# Patient Record
Sex: Male | Born: 1980 | Race: White | Hispanic: No | Marital: Married | Smoking: Former smoker
Health system: Southern US, Community
[De-identification: ages and names within clinical notes are randomized; demographics above are authoritative.]

## PROBLEM LIST (undated history)

## (undated) DIAGNOSIS — E669 Obesity, unspecified: Secondary | ICD-10-CM

## (undated) DIAGNOSIS — E785 Hyperlipidemia, unspecified: Secondary | ICD-10-CM

## (undated) DIAGNOSIS — E291 Testicular hypofunction: Secondary | ICD-10-CM

## (undated) DIAGNOSIS — R569 Unspecified convulsions: Secondary | ICD-10-CM

## (undated) DIAGNOSIS — I10 Essential (primary) hypertension: Secondary | ICD-10-CM

## (undated) DIAGNOSIS — K219 Gastro-esophageal reflux disease without esophagitis: Secondary | ICD-10-CM

## (undated) DIAGNOSIS — T7840XA Allergy, unspecified, initial encounter: Secondary | ICD-10-CM

## (undated) DIAGNOSIS — B690 Cysticercosis of central nervous system: Secondary | ICD-10-CM

## (undated) HISTORY — PX: BRAIN SURGERY: SHX531

## (undated) HISTORY — DX: Obesity, unspecified: E66.9

## (undated) HISTORY — DX: Testicular hypofunction: E29.1

## (undated) HISTORY — DX: Unspecified convulsions: R56.9

## (undated) HISTORY — DX: Cysticercosis of central nervous system: B69.0

## (undated) HISTORY — DX: Essential (primary) hypertension: I10

## (undated) HISTORY — DX: Hyperlipidemia, unspecified: E78.5

## (undated) HISTORY — DX: Allergy, unspecified, initial encounter: T78.40XA

## (undated) HISTORY — DX: Gastro-esophageal reflux disease without esophagitis: K21.9

---
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
## 2008-12-15 ENCOUNTER — Ambulatory Visit: Payer: Self-pay | Admitting: Internal Medicine

## 2008-12-16 ENCOUNTER — Ambulatory Visit: Payer: Self-pay | Admitting: Family Medicine

## 2008-12-16 DIAGNOSIS — R519 Headache, unspecified: Secondary | ICD-10-CM | POA: Insufficient documentation

## 2008-12-16 DIAGNOSIS — R51 Headache: Secondary | ICD-10-CM | POA: Insufficient documentation

## 2008-12-16 DIAGNOSIS — E669 Obesity, unspecified: Secondary | ICD-10-CM

## 2008-12-16 DIAGNOSIS — K219 Gastro-esophageal reflux disease without esophagitis: Secondary | ICD-10-CM | POA: Insufficient documentation

## 2008-12-16 DIAGNOSIS — G40401 Other generalized epilepsy and epileptic syndromes, not intractable, with status epilepticus: Secondary | ICD-10-CM | POA: Insufficient documentation

## 2008-12-16 DIAGNOSIS — E1165 Type 2 diabetes mellitus with hyperglycemia: Secondary | ICD-10-CM | POA: Insufficient documentation

## 2008-12-17 ENCOUNTER — Encounter (INDEPENDENT_AMBULATORY_CARE_PROVIDER_SITE_OTHER): Payer: Self-pay | Admitting: *Deleted

## 2008-12-17 LAB — CONVERTED CEMR LAB
BUN: 12 mg/dL (ref 6–23)
Calcium: 9.2 mg/dL (ref 8.4–10.5)
Creatinine,U: 191.1 mg/dL
GFR calc non Af Amer: 122.34 mL/min (ref >60)
Glucose, Bld: 163 mg/dL — ABNORMAL HIGH (ref 70–99)
Hgb A1c MFr Bld: 7.9 % — ABNORMAL HIGH (ref 4.6–6.5)
Microalb, Ur: 0.8 mg/dL (ref 0.0–1.9)
Potassium: 4.1 meq/L (ref 3.5–5.1)
VLDL: 62.8 mg/dL — ABNORMAL HIGH (ref 0.0–40.0)

## 2009-01-15 ENCOUNTER — Encounter (INDEPENDENT_AMBULATORY_CARE_PROVIDER_SITE_OTHER): Payer: Self-pay | Admitting: *Deleted

## 2009-01-15 ENCOUNTER — Ambulatory Visit: Payer: Self-pay | Admitting: Family Medicine

## 2009-01-15 DIAGNOSIS — E785 Hyperlipidemia, unspecified: Secondary | ICD-10-CM | POA: Insufficient documentation

## 2009-01-15 LAB — CONVERTED CEMR LAB: Cholesterol, target level: 200 mg/dL

## 2009-04-15 ENCOUNTER — Encounter (INDEPENDENT_AMBULATORY_CARE_PROVIDER_SITE_OTHER): Payer: Self-pay | Admitting: *Deleted

## 2010-01-26 ENCOUNTER — Telehealth (INDEPENDENT_AMBULATORY_CARE_PROVIDER_SITE_OTHER): Payer: Self-pay | Admitting: *Deleted

## 2010-03-01 ENCOUNTER — Telehealth: Payer: Self-pay | Admitting: Family Medicine

## 2010-03-29 ENCOUNTER — Ambulatory Visit: Payer: Self-pay | Admitting: Family Medicine

## 2010-03-29 DIAGNOSIS — R030 Elevated blood-pressure reading, without diagnosis of hypertension: Secondary | ICD-10-CM

## 2010-03-31 ENCOUNTER — Encounter (INDEPENDENT_AMBULATORY_CARE_PROVIDER_SITE_OTHER): Payer: Self-pay | Admitting: *Deleted

## 2010-03-31 LAB — CONVERTED CEMR LAB
ALT: 55 units/L — ABNORMAL HIGH (ref 0–53)
Alkaline Phosphatase: 62 units/L (ref 39–117)
Basophils Relative: 0.9 % (ref 0.0–3.0)
Bilirubin, Direct: 0.2 mg/dL (ref 0.0–0.3)
Calcium: 8.8 mg/dL (ref 8.4–10.5)
Chloride: 97 meq/L (ref 96–112)
Cholesterol: 174 mg/dL (ref 0–200)
Creatinine, Ser: 0.8 mg/dL (ref 0.4–1.5)
Eosinophils Relative: 2.1 % (ref 0.0–5.0)
GFR calc non Af Amer: 116.19 mL/min (ref >60)
HDL: 40.6 mg/dL (ref >39.00)
Lymphocytes Relative: 41.6 % (ref 12.0–46.0)
MCV: 85.5 fL (ref 78.0–100.0)
Monocytes Relative: 12.9 % — ABNORMAL HIGH (ref 3.0–12.0)
Neutrophils Relative %: 42.5 % — ABNORMAL LOW (ref 43.0–77.0)
Platelets: 234 10*3/uL (ref 150.0–400.0)
RBC: 4.88 M/uL (ref 4.22–5.81)
Total Bilirubin: 1 mg/dL (ref 0.3–1.2)
Total CHOL/HDL Ratio: 4
Total Protein: 6.8 g/dL (ref 6.0–8.3)
VLDL: 49.8 mg/dL — ABNORMAL HIGH (ref 0.0–40.0)
WBC: 6.2 10*3/uL (ref 4.5–10.5)

## 2010-04-06 ENCOUNTER — Encounter: Payer: Self-pay | Admitting: Family Medicine

## 2010-04-13 ENCOUNTER — Telehealth: Payer: Self-pay | Admitting: Family Medicine

## 2010-06-29 NOTE — Progress Notes (Signed)
Summary: refill request for simvastatin denied  Phone Note From Pharmacy   Caller: [facility] [address] Summary of Call: Refill request for simvastatin denied, pt needs office visit. Initial call taken by: Lowella Petties CMA,  March 01, 2010 12:38 PM  Follow-up for Phone Call        agree  dm, no ov > 1 year  schedule f/u Follow-up by: [name] MD,  March 01, 2010 1:31 PM  Additional Follow-up for Phone Call Additional follow up Details #1::        Patient number is wrong number but, I notified pharmacy rx denied and patient needs visit.Consuello Masse CMA   Additional Follow-up by: Benny Lennert CMA Duncan Dull),  March 01, 2010 2:33 PM

## 2010-06-29 NOTE — Progress Notes (Signed)
Summary: can't afford lipitor  Phone Note Call from Patient Call back at Home Phone [phone]   Caller: Spouse Call For: [name] MD Summary of Call: Wife went to pick up rx for lipitor. She had the $4.00 coupon and she said that w/ her husband's insurance they were not able to use the coupon. She says that it will cost him over $150.00. They can't afford that right now and they are asking if he can switch to something generic. Please advise. Uses [facility] [address]  Initial call taken by: Melody Comas,  April 13, 2010 11:06 AM  Follow-up for Phone Call        zocor 40 mg, 1 by mouth at bedtime, #30, 5 refills  make change, call in, med list [name] MD  April 13, 2010 11:12 AM       New/Updated Medications: ZOCOR 40 MG TABS (SIMVASTATIN) one tablet at bedtime Prescriptions: ZOCOR 40 MG TABS (SIMVASTATIN) one tablet at bedtime  #30 x 5   Entered by:   Benny Lennert CMA (AAMA)   Authorized by:   [name] MD   Signed by:   Benny Lennert CMA (AAMA) on 04/13/2010   Method used:   Electronically to        [facility] [address]* (retail)       [address] [place], [place]  [address]       Ph: [phone]       Fax: [phone]   RxID:   [id number]   Prior Medications: METFORMIN HCL 500 MG XR24H-TAB (METFORMIN HCL) take three tablets daily for 1 week then 4 tablets daily BLOOD SUGAR MONITOR LANCETS () 250.00 Dx Check blood sugar two times a day BLOOD SUGAR MONITOR STRIPS () Dx 250.00 Check blood sugar two times a day Current Allergies: ! PENICILLIN ! DILANTIN ! TEGRETOL

## 2010-06-29 NOTE — Miscellaneous (Signed)
  Clinical Lists Changes  Medications: Changed medication from METFORMIN HCL 500 MG XR24H-TAB (METFORMIN HCL) 2 tabs by mouth by mouth daily to METFORMIN HCL 500 MG XR24H-TAB (METFORMIN HCL) take three tablets daily for 1 week then 4 tablets daily - Signed Added new medication of LIPITOR 80 MG TABS (ATORVASTATIN CALCIUM) take one tablet daily - Signed Removed medication of SIMVASTATIN 40 MG TABS (SIMVASTATIN) 1 by mouth at bedtime Rx of METFORMIN HCL 500 MG XR24H-TAB (METFORMIN HCL) take three tablets daily for 1 week then 4 tablets daily;  #120 x 3;  Signed;  Entered by: Benny Lennert CMA (AAMA);  Authorized by: [name] MD;  Method used: [facility]ically to [facility] [address] [place], [place], [place]  [address], Ph: [phone], Fax: [phone] Rx of LIPITOR 80 MG TABS (ATORVASTATIN CALCIUM) take one tablet daily;  #30 x 3;  Signed;  Entered by: Benny Lennert CMA (AAMA);  Authorized by: [name] MD;  Method used: Electronically to [facility] [address], [place], [place], [place]  [address], Ph: [phone], Fax: [phone]    Prescriptions: LIPITOR 80 MG TABS (ATORVASTATIN CALCIUM) take one tablet daily  #30 x 3   Entered by:   Benny Lennert CMA (AAMA)   Authorized by:   [name] MD   Signed by:   Benny Lennert CMA (AAMA) on 03/31/2010   Method used:   Electronically to        [facility] [address] (retail)       [address] [place], [place]  [address]       Ph: [phone]       Fax: [phone]   RxID:   [id number] METFORMIN HCL 500 MG XR24H-TAB (METFORMIN HCL) take three tablets daily for 1 week then 4 tablets daily  #120 x 3   Entered by:   Benny Lennert CMA (AAMA)   Authorized by:   [name] MD   Signed by:   Benny Lennert CMA (AAMA) on 03/31/2010   Method used:   Electronically to        [facility] [address]*  (retail)       [address] [place], [place]  [address]       Ph: [phone]       Fax: [phone]   RxID:   [id number]   Prior Medications: BLOOD SUGAR MONITOR LANCETS () 250.00 Dx Check blood sugar two times a day BLOOD SUGAR MONITOR STRIPS () Dx 250.00 Check blood sugar two times a day Current Allergies: ! PENICILLIN ! DILANTIN ! TEGRETOL

## 2010-06-29 NOTE — Letter (Signed)
Summary: Unable to Contact Patient/[facility]  Unable to Contact Patient/[facility]   Imported By: Lanelle Bal 04/16/2010 14:06:09  _____________________________________________________________________  External Attachment:    Type:   Image     Comment:   External Document

## 2010-06-29 NOTE — Progress Notes (Signed)
Summary: METFORMIN  Phone Note Refill Request Message from:  Arnoldo Lenis [phone] on January 26, 2010 4:48 PM  Refills Requested: Medication #1:  METFORMIN HCL 500 MG XR24H-TAB 2 tabs by mouth by mouth daily   Last Refilled: 12/14/2009 faxed request.   Follow-up for Phone Call        left message on machine at home for patient to return my call. Pt needs to be seen for further refills, pt has cancelled appts and no-showed is this ok to refill if he make an appt? DeShannon Smith CMA Duncan Dull)  January 26, 2010 4:49 PM   Additional Follow-up for Phone Call Additional follow up Details #1::        agree refill 1 month only  let him know -- he is a diabetic, cannot go for a year without any surveilance at all! Additional Follow-up by: [name] MD,  January 26, 2010 4:56 PM    Prescriptions: METFORMIN HCL 500 MG XR24H-TAB (METFORMIN HCL) 2 tabs by mouth by mouth daily  #60 x 11   Entered by:   Benny Lennert CMA (AAMA)   Authorized by:   [name] MD   Signed by:   Benny Lennert CMA (AAMA) on 01/28/2010   Method used:   Electronically to        [facility] [address]* (retail)       [address] [place], [place]  [address]       Ph: [phone]       Fax: [phone]   RxID:   [id number]  Pharmacy advised electronic refills was supposed to be for 1 month not 11.Consuello Masse CMA    Patient advised via voice mail to call and schedule appt before furthur rx given.Consuello Masse CMA

## 2010-06-29 NOTE — Assessment & Plan Note (Signed)
Summary: F/U REFILL MEDICATION/CLE   Vital Signs:  Patient profile:   30 year old male Height:      72 inches Weight:      274.0 pounds BMI:     37.30 Temp:     97.6 degrees F oral BP sitting:   140 / 90  (left arm) Cuff size:   regular  Vitals Entered By: Benny Lennert CMA Duncan Dull) (March 29, 2010 8:07 AM)  History of Present Illness: Chief complaint refill medication(diabetes)  DM: has not been checking his BS  flu shot at work  Lipids: reports compliance  has not come back in for follow-up in > 1 year  124/80 on my recheck on BP with large cuff.  reports passing metformin capsule in his stool  no weight loss. not exercising  REVIEW OF SYSTEMS GEN: No acute illnesses, no fever, chills, sweats. CV: No chest pain or SOB GI: No noted N or V Otherwise, pertinent positives and negatives are noted in the HPI.   GEN: WDWN, NAD, Non-toxic, A & O x 3 HEENT: Atraumatic, Normocephalic. Neck supple. No masses, No LAD. Ears and Nose: No external deformity. CV: RRR, No M/G/R. No JVD. No thrill. No extra heart sounds. PULM: CTA B, no wheezes, crackles, rhonchi. No retractions. No resp. distress. No accessory muscle use. EXTR: No c/c/e NEURO: Normal gait.  PSYCH: Normally interactive. Conversant. Not depressed or anxious appearing.  Calm demeanor.    Allergies: 1)  ! Penicillin 2)  ! Dilantin 3)  ! Tegretol  Past History:  Past medical, surgical, family and social histories (including risk factors) reviewed, and no changes noted (except as noted below).  Past Medical History: GERD (ICD-530.81) h/o seizures, now no meds Diabetes mellitus, type II Obesity Hyperlipidemia hypogonadism  Past Surgical History: Reviewed history from 12/16/2008 and no changes required. none  Family History: Reviewed history from 12/16/2008 and no changes required. Family History of Arthritis Family History Diabetes 1st degree relative (father)  Social History: Reviewed history  from 12/16/2008 and no changes required. Occupation: Married Never Smoked Alcohol use-no Drug use-no Regular exercise-no  Review of Systems      See HPI General:  Denies chills and fever. CV:  Denies chest pain or discomfort; conditioning improving. GI:  See HPI. GU:  See HPI.  Physical Exam  Additional Exam:  GEN: WDWN, NAD, Non-toxic, A & O x 3 HEENT: Atraumatic, Normocephalic. Neck supple. No masses, No LAD. Ears and Nose: No external deformity. CV: RRR, No M/G/R. No JVD. No thrill. No extra heart sounds. PULM: CTA B, no wheezes, crackles, rhonchi. No retractions. No resp. distress. No accessory muscle use. EXTR: No c/c/e NEURO: Normal gait.  PSYCH: Normally interactive. Conversant. Not depressed or anxious appearing.  Calm demeanor.     Impression & Recommendations:  Problem # 1:  DIABETES MELLITUS, TYPE II (ICD-250.00)  His updated medication list for this problem includes:    Metformin Hcl 500 Mg Xr24h-tab (Metformin hcl) .Marland Kitchen... 2 tabs by mouth by mouth daily  Orders: TLB-A1C / Hgb A1C (Glycohemoglobin) (83036-A1C) Diabetic Clinic Referral (Diabetic)  Problem # 2:  HYPERLIPIDEMIA (ICD-272.4)  His updated medication list for this problem includes:    Simvastatin 40 Mg Tabs (Simvastatin) .Marland Kitchen... 1 by mouth at bedtime  Orders: Venipuncture (46962) TLB-Lipid Panel (80061-LIPID) TLB-Hepatic/Liver Function Pnl (80076-HEPATIC)  Problem # 3:  ELEVATED BLOOD PRESSURE WITHOUT DIAGNOSIS OF HYPERTENSION (ICD-796.2) normal on second check follow at home  Problem # 4:  OBESITY (ICD-278.00) Assessment: Unchanged  Complete Medication  List: 1)  Metformin Hcl 500 Mg Xr24h-tab (Metformin hcl) .... 2 tabs by mouth by mouth daily 2)  Blood Sugar Monitor Lancets  .Marland KitchenMarland Kitchen. 250.00 dx check blood sugar two times a day 3)  Blood Sugar Monitor Strips  .... Dx 250.00 check blood sugar two times a day 4)  Simvastatin 40 Mg Tabs (Simvastatin) .Marland Kitchen.. 1 by mouth at bedtime  Other  Orders: TLB-BMP (Basic Metabolic Panel-BMET) (80048-METABOL) TLB-CBC Platelet - w/Differential (85025-CBCD) Pneumococcal Vaccine (04540) Admin 1st Vaccine (98119)  Patient Instructions: 1)  Referral Appointment Information 2)  Day/Date: 3)  Time: 4)  Place/MD: 5)  Address: 6)  Phone/Fax: 7)  Patient given appointment information. Information/Orders faxed/mailed.  Prescriptions: SIMVASTATIN 40 MG TABS (SIMVASTATIN) 1 by mouth at bedtime  #30 x 0   Entered by:   Benny Lennert CMA (AAMA)   Authorized by:   [name] MD   Signed by:   Benny Lennert CMA (AAMA) on 03/29/2010   Method used:   Electronically to        [facility] [address]* (retail)       [address] [place], [place]  [address]       Ph: [phone]       Fax: [phone]   RxID:   [id number] METFORMIN HCL 500 MG XR24H-TAB (METFORMIN HCL) 2 tabs by mouth by mouth daily  #60 x 11   Entered by:   Benny Lennert CMA (AAMA)   Authorized by:   [name] MD   Signed by:   Benny Lennert CMA (AAMA) on 03/29/2010   Method used:   Electronically to        [facility] [address] (retail)       [address] [place], [place]  [address]       Ph: [phone]       Fax: [phone]   RxID:   [id number] SIMVASTATIN 40 MG TABS (SIMVASTATIN) 1 by mouth at bedtime  #30 x 0   Entered and Authorized by:   [name] MD   Signed by:   [name] MD on 03/29/2010   Method used:   Electronically to        [facility] [facility] (retail[facility]       [address]       [place], [place]  [address]       Ph: [phone] or [phone]       Fax: [phone]   RxID:   [id number] METFORMIN HCL 500 MG XR24H-TAB (METFORMIN HCL) 2 tabs by mouth by mouth daily  #60 x 11   Entered and Authorized by:   [name] MD   Signed by:   [name] MD on 03/29/2010   Method used:   [facility]        [facility] [facility]       [address]       [place], [place]  [address]       Ph: [phone] or [phone]       Fax: [phone]   RxID:   [id number]    Orders Added: 1)  Venipuncture [[address]] 2)  TLB-Lipid Panel [80061-LIPID] 3)  TLB-BMP (Basic Metabolic Panel-BMET) [80048-METABOL] 4)  TLB-CBC Platelet - w/Differential [85025-CBCD] 5)  TLB-Hepatic/Liver Function Pnl [80076-HEPATIC] 6)  TLB-A1C /  Hgb A1C (Glycohemoglobin) [83036-A1C] 7)  Pneumococcal Vaccine [90732] 8)  Admin 1st Vaccine [90471] 9)  Diabetic Clinic Referral [Diabetic] 10)  Est. Patient Level IV [40981]   Immunizations Administered:  Pneumonia Vaccine:    Vaccine Type: Pneumovax    Site: right deltoid    Mfr: Merck    Dose: 0.5 ml    Route: [place]    Given by: Benny Lennert CMA (AAMA)    Exp. Date: 09/21/2011    Lot #: [id number]    VIS given: 05/04/09 version given March 29, 2010.   Immunizations Administered:  Pneumonia Vaccine:    Vaccine Type: Pneumovax    Site: right deltoid    Mfr: Merck    Dose: 0.5 ml    Route:     Given by: Benny Lennert CMA (AAMA)    Exp. Date: 09/21/2011    Lot #: [id number]    VIS given: 05/04/09 version given March 29, 2010.  Current Allergies (reviewed today): ! PENICILLIN ! DILANTIN ! TEGRETOL

## 2010-07-06 IMAGING — CT CT HEAD WITHOUT CONTRAST
1 series · 16 of 30 positions shown, 20 images · non-contrast
Comparison: none

REASON FOR EXAM: HA nausea malaise fatigue
COMMENTS:

[Series 2: soft tissue · axial · 0.39mm/px · z∈[+827,+962]mm · 16 of 31 slices shown, 20 images]
[im 2/31  brain]
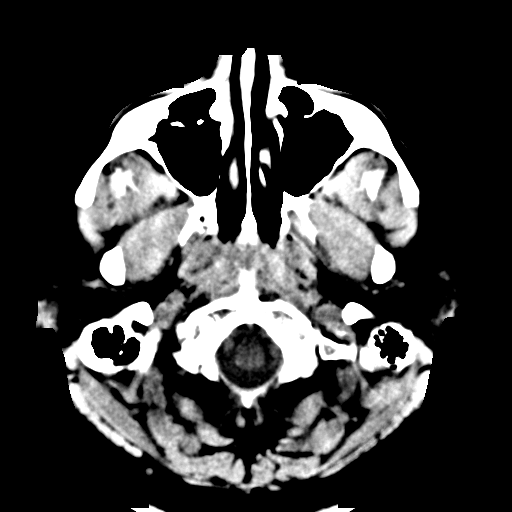
[im 2/31  bone]
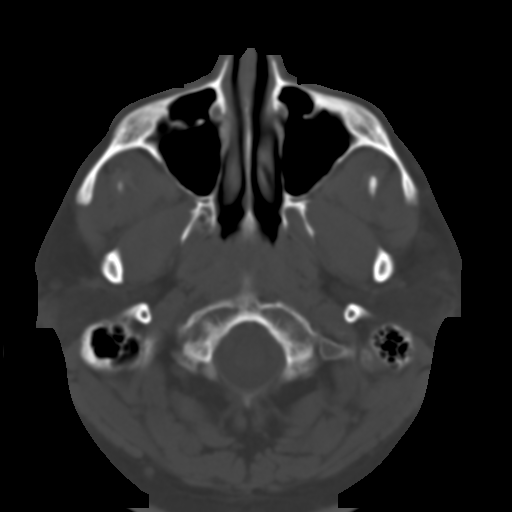
[im 4/31  brain]
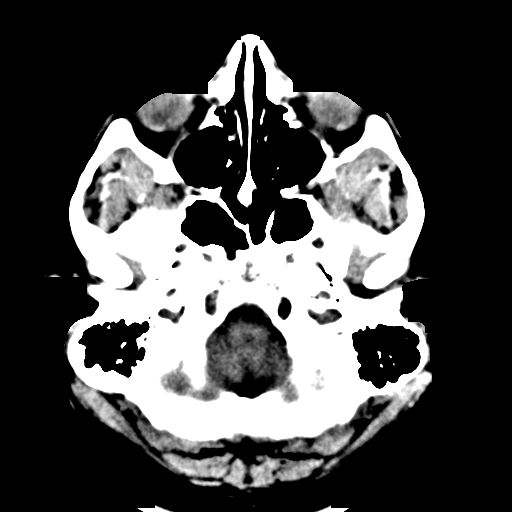
[im 6/31  brain]
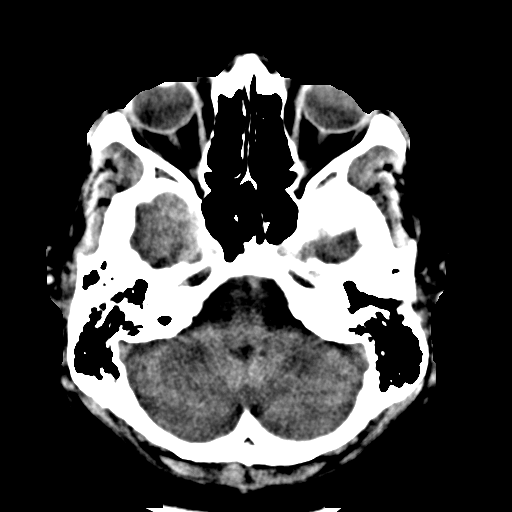
[im 8/31  brain]
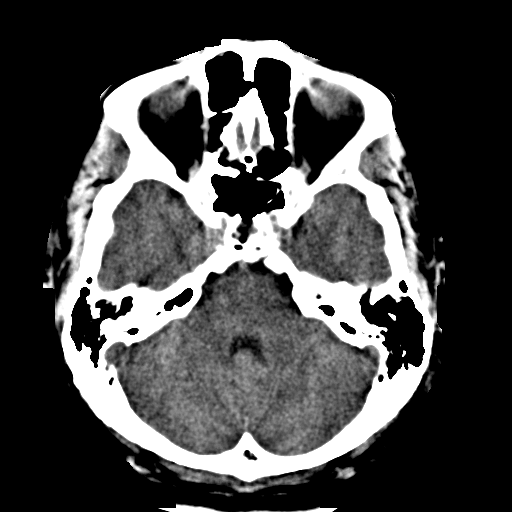
[im 9/31  brain]
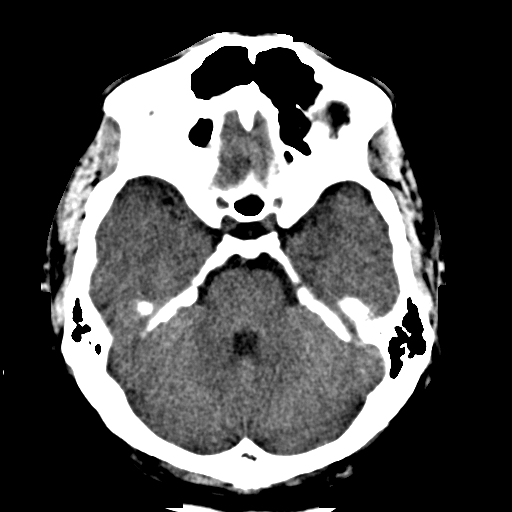
[im 9/31  bone]
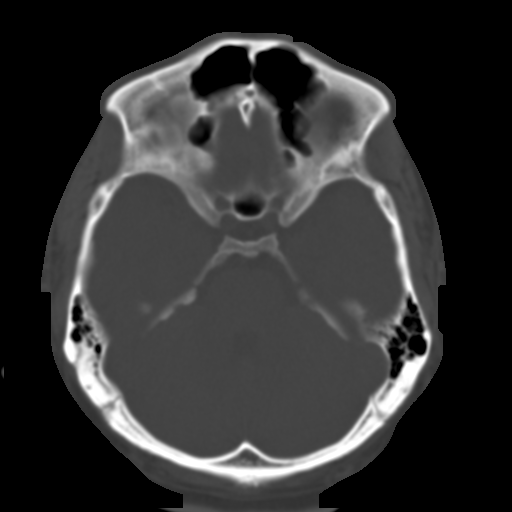
[im 11/31  brain]
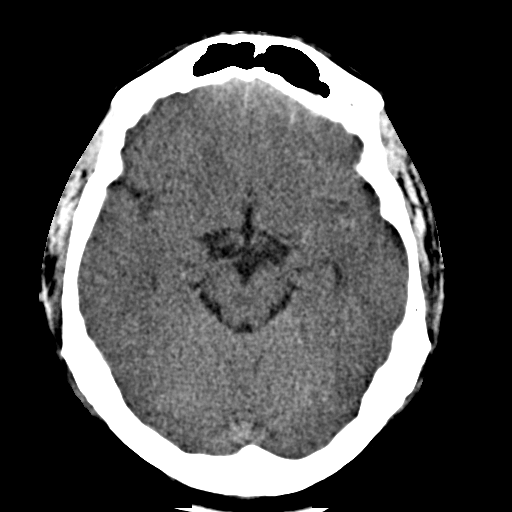
[im 13/31  brain]
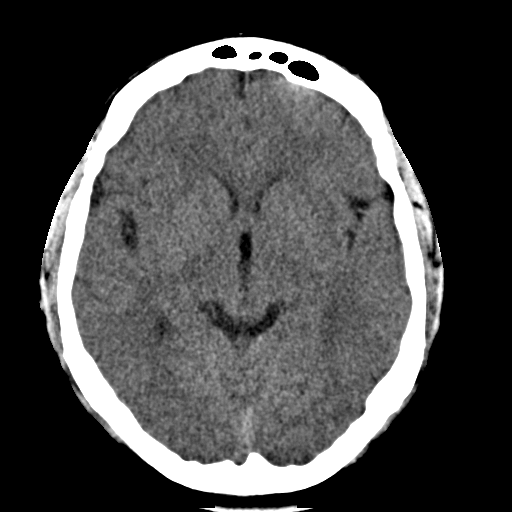
[im 15/31  brain]
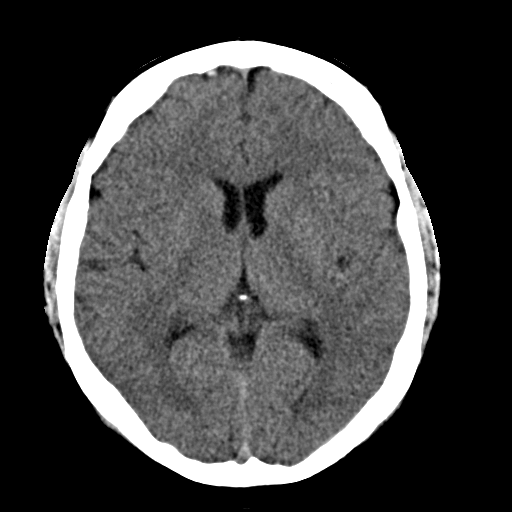
[im 16/31  brain]
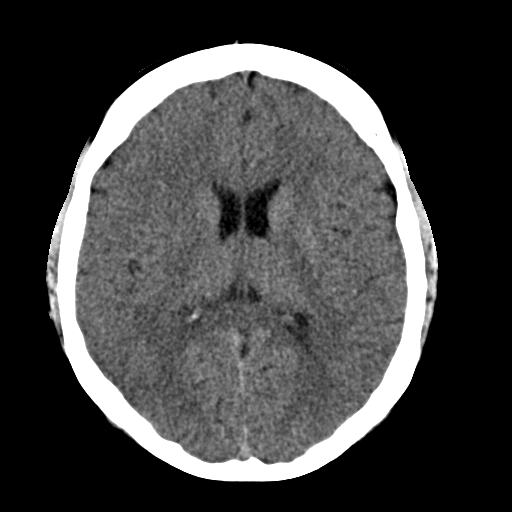
[im 16/31  bone]
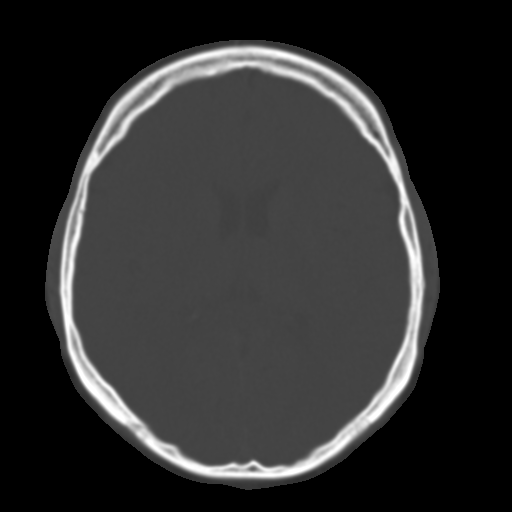
[im 18/31  brain]
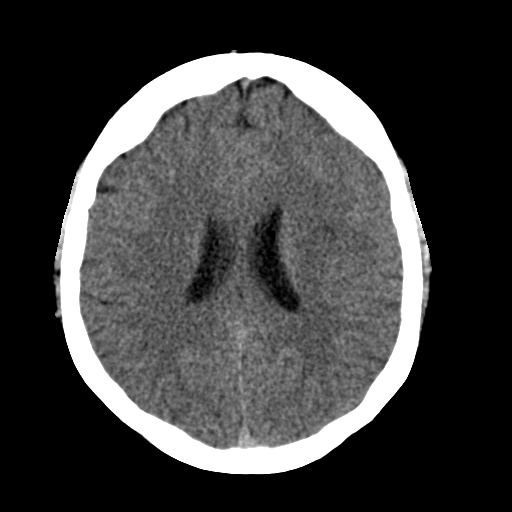
[im 20/31  brain]
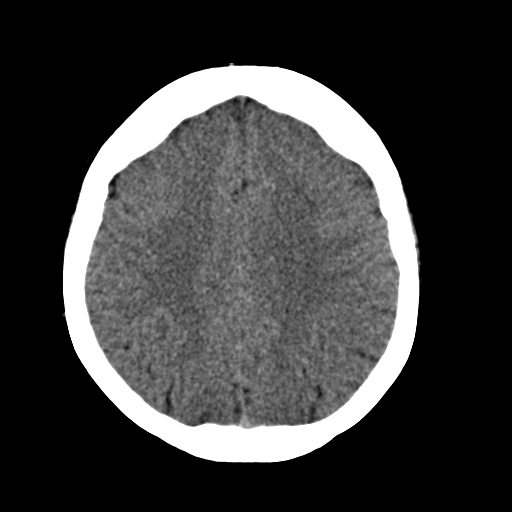
[im 22/31  brain]
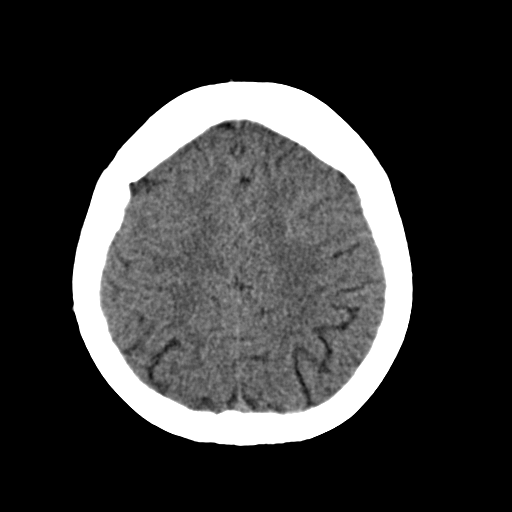
[im 23/31  brain]
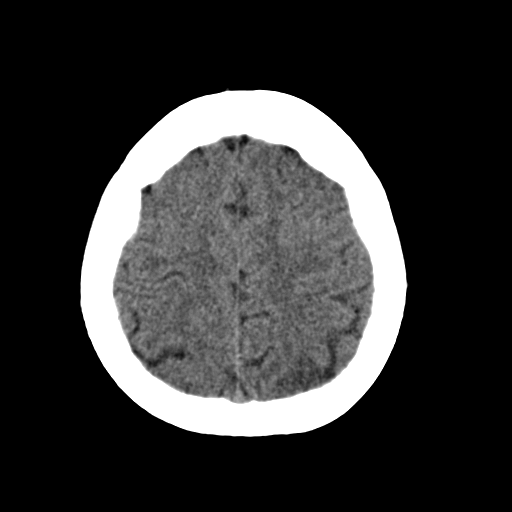
[im 23/31  bone]
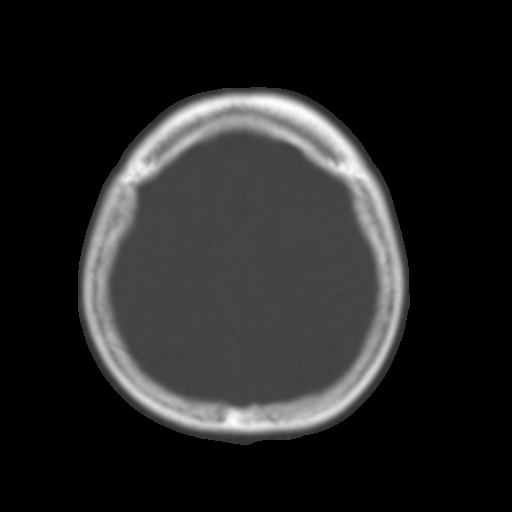
[im 25/31  brain]
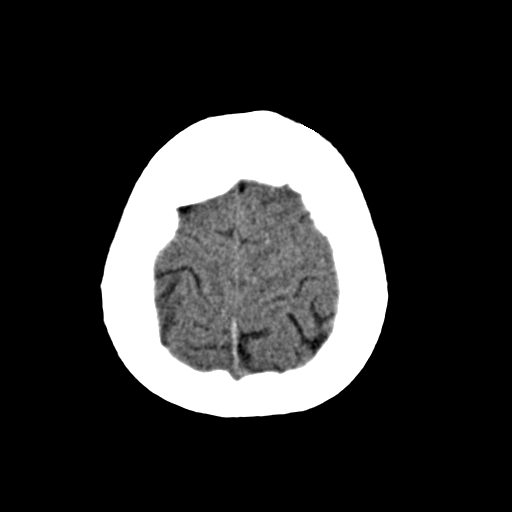
[im 27/31  brain]
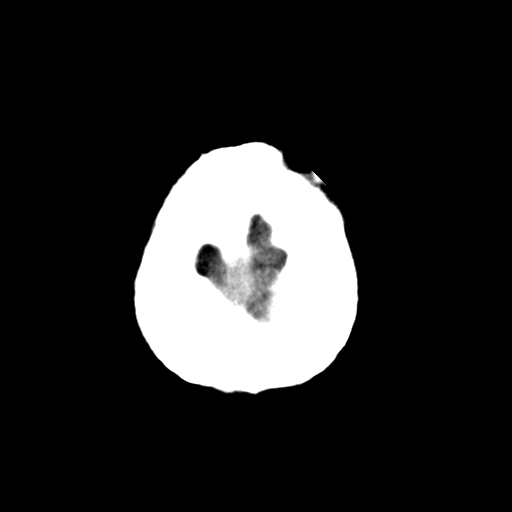
[im 29/31  brain]
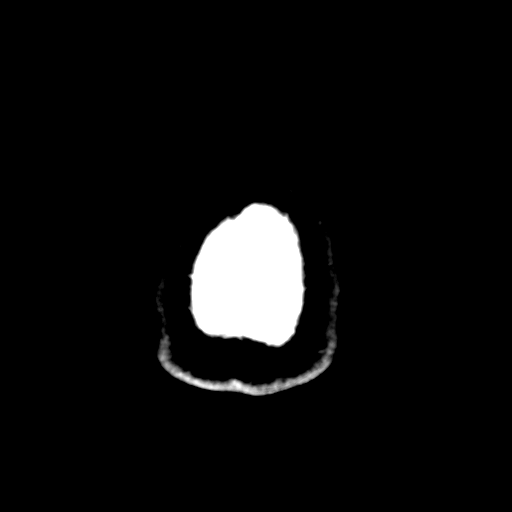

[16 of 30 positions shown; findings below may reference images not displayed]

PROCEDURE:     CT  - CT HEAD WITHOUT CONTRAST  - December 15, 2008  [DATE]

RESULT:       Helical 5 mm sections were obtained from the skull base to the
vertex without the administration of intravenous contrast. Also included is
bone windowing of the osseous structures.

There is no evidence of intraaxial or extraaxial fluid collections. There is
no evidence of acute hemorrhage or secondary signs reflecting mass effect,
subacute or chronic infarction.  The visualized bony skeleton is evaluated
and there is no evidence of a depressed skull fracture or further fracture
or dislocation.  The visualized mastoid air cells are clear.  The
ventricles, cisterns and sulci are symmetric and patent.

If there is persistent clinical concern, further evaluation with Brain MRI
is recommended if clinically indicated.
IMPRESSION: Unremarkable Head CT as described above.

## 2010-08-04 ENCOUNTER — Encounter: Payer: Self-pay | Admitting: Family Medicine

## 2010-09-30 ENCOUNTER — Ambulatory Visit (INDEPENDENT_AMBULATORY_CARE_PROVIDER_SITE_OTHER): Payer: BC Managed Care – PPO | Admitting: Family Medicine

## 2010-09-30 ENCOUNTER — Encounter: Payer: Self-pay | Admitting: Family Medicine

## 2010-09-30 ENCOUNTER — Ambulatory Visit: Payer: Self-pay | Admitting: Family Medicine

## 2010-09-30 DIAGNOSIS — E785 Hyperlipidemia, unspecified: Secondary | ICD-10-CM

## 2010-09-30 DIAGNOSIS — I10 Essential (primary) hypertension: Secondary | ICD-10-CM

## 2010-09-30 DIAGNOSIS — E119 Type 2 diabetes mellitus without complications: Secondary | ICD-10-CM

## 2010-09-30 LAB — LIPID PANEL
HDL: 46.6 mg/dL (ref >39.00)
LDL Cholesterol: 118 mg/dL — ABNORMAL HIGH (ref 0–99)
VLDL: 32.6 mg/dL (ref 0.0–40.0)

## 2010-09-30 LAB — HEMOGLOBIN A1C: Hgb A1c MFr Bld: 8.2 % — ABNORMAL HIGH (ref 4.6–6.5)

## 2010-09-30 MED ORDER — LISINOPRIL 10 MG PO TABS: 10.0000 mg | ORAL_TABLET | Freq: Every day | ORAL | Status: DC

## 2010-09-30 NOTE — Progress Notes (Signed)
30 year old:  Diabetes Mellitus: Tolerating Medications: Compliance with diet: fair - lost 12 pounds Exercise: none Avg blood sugars at home: 150 Foot problems: none Hypoglycemia: none No nausea, vomitting, blurred vision, polyuria.  Lab Results  Component Value Date   HGBA1C 8.2* 09/30/2010      Chemistry      Component Value Date/Time   NA 140 03/29/2010 0832   K 4.1 03/29/2010 0832   CL 97 03/29/2010 0832   CO2 28 03/29/2010 0832   BUN 11 03/29/2010 0832   CREATININE 0.8 03/29/2010 0832      Component Value Date/Time   CALCIUM 8.8 03/29/2010 0832   ALKPHOS 62 03/29/2010 0832   AST 34 03/29/2010 0832   ALT 55* 03/29/2010 0832   BILITOT 1.0 03/29/2010 0832     Lipids: Could not afford Lipitor co-pay, back on generic statin. Tolerating meds fine with no SE. Panel reviewed with patient - from fall  Lab Results  Component Value Date   CHOL 197 09/30/2010   CHOL 174 03/29/2010   CHOL 265* 12/16/2008   Lab Results  Component Value Date   HDL 46.60 09/30/2010   HDL 40.60 03/29/2010   HDL 42.70 12/16/2008   Lab Results  Component Value Date   LDLCALC 118* 09/30/2010   LDLCALC 164 12/16/2008   Lab Results  Component Value Date   TRIG 163.0* 09/30/2010   TRIG 249.0* 03/29/2010   TRIG 314.0* 12/16/2008   Lab Results  Component Value Date   CHOLHDL 4 09/30/2010   CHOLHDL 4 03/29/2010   CHOLHDL 6 12/16/2008   Lab Results  Component Value Date   LDLDIRECT 95.3 03/29/2010   LDLDIRECT 164.0 12/16/2008    Lab Results  Component Value Date   ALT 55* 03/29/2010   AST 34 03/29/2010   ALKPHOS 62 03/29/2010   BILITOT 1.0 03/29/2010   HTN:  New dx.  Recheck, 148/88 No CP, no sob. No HA.  BP Readings from Last 3 Encounters:  09/30/10 140/80  03/29/10 140/90  01/15/09 120/80   Dad lost a toe from diabetes.   The PMH, PSH, Social History, Family History, Medications, and allergies have been reviewed in [facility], and have been updated if relevant.  ROS: GEN: No acute  illnesses, no fevers, chills. GI: No n/v/d, eating normally Pulm: No SOB Interactive and getting along well at home.  Otherwise, ROS is as per the HPI.  GEN: WDWN, NAD, Non-toxic, A & O x 3 HEENT: Atraumatic, Normocephalic. Neck supple. No masses, No LAD. Ears and Nose: No external deformity. CV: RRR, No M/G/R. No JVD. No thrill. No extra heart sounds. PULM: CTA B, no wheezes, crackles, rhonchi. No retractions. No resp. distress. No accessory muscle use.  EXTR: No c/c/e NEURO Normal gait.  PSYCH: Normally interactive. Conversant. Not depressed or anxious appearing.  Calm demeanor.

## 2010-09-30 NOTE — Patient Instructions (Signed)
F/u 6 weeks    Hypertension (High Blood Pressure) Most people with high blood pressure (hypertension) have no symptoms. High blood pressure can be a dangerous problem. Hypertension is dangerous because you may have it and not know it. High blood pressure may mean that your heart needs to work harder to pump blood.  Your blood pressure is measured with 2 numbers. The first number is when your heart flexes (contracts), and the second number is when your heart relaxes. The higher the numbers are, the more you are at risk for problems. Write down your blood pressure today. The best blood pressure for adults is 120/80 (mmHg) or lower. It is likely that your blood pressure was recorded at least 2 times today. It is important for you to give these numbers to your doctor. If you do not have a doctor, try to get follow-up care at a hospital or community clinic. You may need to start high blood pressure medicine. You may also need to adjust your medicines as told by your doctor. Even mild high blood pressure increases long-term health risks. One high reading does not mean you have hypertension.  Your blood pressure should be taken when you are relaxed. It is also important to sit for about 10 minutes before being tested.   Things that can increase your blood pressure are:   Injury.  Illness.   Stress.   Caffeine.  Some medicines (like decongestants).    High blood pressure does not usually need emergency treatment.  HOME CARE  Lifestyle and medicine changes may be needed, including:   Weight loss.   Exercise.   Limit the use of salt.   Stop smoking.   If using decongestants or birth control pills, talk to your doctor. These medicines might make blood pressure higher.   Do not use street drugs.   Females should not drink more than 1 alcoholic drink per day. Males should not drink more than 2 alcoholic drinks per day.   Take your blood pressure medicine. You will need to take it every  day. If you do not get treated, there are risks, including:   Heart disease.   Stroke.   Kidney failure.   See your doctor as told.  GET HELP RIGHT AWAY IF:  You get a very bad headache.   You get blurred or changing vision.   You feel confused.   You feel weak, numb, or faint.   You get chest or belly (abdominal) pain.   You throw up (vomit).   You cannot breathe very well.  If you have a blood pressure reading with a top number of 180 or higher, you need to see your doctor right away. This is especially true if you are having any of the problems listed above. MAKE SURE YOU:  Understand these instructions.   Will watch your condition.   Will get help right away if you are not doing well or get worse.  Document Released: 11/02/2007 Document Re-Released: 11/03/2009 [facility] Patient Information 2011 [place], [place].

## 2010-10-01 ENCOUNTER — Encounter: Payer: Self-pay | Admitting: Family Medicine

## 2010-10-01 DIAGNOSIS — I10 Essential (primary) hypertension: Secondary | ICD-10-CM | POA: Insufficient documentation

## 2010-10-01 HISTORY — DX: Essential (primary) hypertension: I10

## 2010-10-05 ENCOUNTER — Encounter: Payer: Self-pay | Admitting: *Deleted

## 2010-10-11 ENCOUNTER — Other Ambulatory Visit: Payer: Self-pay | Admitting: *Deleted

## 2010-10-11 MED ORDER — SIMVASTATIN 40 MG PO TABS: 40.0000 mg | ORAL_TABLET | Freq: Every day | ORAL | Status: DC

## 2010-10-18 ENCOUNTER — Telehealth: Payer: Self-pay | Admitting: *Deleted

## 2010-10-18 MED ORDER — LOSARTAN POTASSIUM 25 MG PO TABS: 25.0000 mg | ORAL_TABLET | Freq: Every day | ORAL | Status: DC

## 2010-10-18 NOTE — Telephone Encounter (Signed)
Pt has been taking lisinopril for about 2 weeks and has been coughing and has not felt well since starting it. He was told to call and report if he had any problems with it.

## 2010-10-18 NOTE — Telephone Encounter (Signed)
D/c lisinopril  Sent in Losartan - a different BP med into [facility].   It may take up to a month for all coughing to go away assuming it is from the medication.

## 2010-10-19 NOTE — Telephone Encounter (Signed)
Patients wife advised via message on machine

## 2010-11-16 ENCOUNTER — Ambulatory Visit (INDEPENDENT_AMBULATORY_CARE_PROVIDER_SITE_OTHER): Payer: BC Managed Care – PPO | Admitting: Family Medicine

## 2010-11-16 ENCOUNTER — Encounter: Payer: Self-pay | Admitting: Family Medicine

## 2010-11-16 DIAGNOSIS — E785 Hyperlipidemia, unspecified: Secondary | ICD-10-CM

## 2010-11-16 DIAGNOSIS — E119 Type 2 diabetes mellitus without complications: Secondary | ICD-10-CM

## 2010-11-16 DIAGNOSIS — I10 Essential (primary) hypertension: Secondary | ICD-10-CM

## 2010-11-16 MED ORDER — LOSARTAN POTASSIUM 50 MG PO TABS: 50.0000 mg | ORAL_TABLET | Freq: Every day | ORAL | Status: DC

## 2010-11-16 NOTE — Progress Notes (Signed)
Benjamin Proctor, a 30 y.o. male presents today in the office for the following:   Wt Readings from Last 3 Encounters:  11/16/10 265 lb (120.203 kg)  09/30/10 262 lb 6.4 oz (119.024 kg)  03/29/10 274 lb (124.286 kg)   DM: BS 130-140, better off sodas  HTN: 130/85  HTN: Tolerating all medications without side effects (had cough from lisinopril) Stable and at goal No CP, no sob. No HA.  BP Readings from Last 3 Encounters:  11/16/10 130/90  09/30/10 140/80  03/29/10 140/90    Basic Metabolic Panel:    Component Value Date/Time   NA 140 03/29/2010 0832   K 4.1 03/29/2010 0832   CL 97 03/29/2010 0832   CO2 28 03/29/2010 0832   BUN 11 03/29/2010 0832   CREATININE 0.8 03/29/2010 0832   GLUCOSE 189* 03/29/2010 0832   CALCIUM 8.8 03/29/2010 0832    Lipids: tol meds  The PMH, PSH, Social History, Family History, Medications, and allergies have been reviewed in [facility], and have been updated if relevant.  ROS: GEN: No acute illnesses, no fevers, chills. GI: No n/v/d, eating normally Pulm: No SOB Interactive and getting along well at home.  Otherwise, ROS is as per the HPI.   Physical Exam  Blood pressure 130/90, pulse 96, temperature 97.7 F (36.5 C), temperature source Oral, weight 265 lb (120.203 kg), SpO2 98.00%.  GEN: WDWN, NAD, Non-toxic, A & O x 3 HEENT: Atraumatic, Normocephalic. Neck supple. No masses, No LAD. Ears and Nose: No external deformity. CV: RRR, No M/G/R. No JVD. No thrill. No extra heart sounds. PULM: CTA B, no wheezes, crackles, rhonchi. No retractions. No resp. distress. No accessory muscle use. EXTR: No c/c/e NEURO Normal gait.  PSYCH: Normally interactive. Conversant. Not depressed or anxious appearing.  Calm demeanor.   A/P: HTN, improving, not at goal, increase ARB

## 2010-11-16 NOTE — Patient Instructions (Signed)
F/u 3-4 months  WE INCREASE THE LOSARTAN (BLOOD PRESSURE MEDICINE) -- TO 50 MG. TAKE YOUR 25 MG TABLETS YOU HAVE NOW, AND TAKE 2 A DAY   WITH THE REFILL, 1 A DAY.  Work on your diet: Try to eat minimal fatty food and eat more fruits and vegetables. Anything fried is bad, cream, beef and pork fat are bad.  Exercise is incredibly important to heart health. Try to exercise for at least 30 minutes 5 - 6 times a week

## 2010-12-03 ENCOUNTER — Other Ambulatory Visit: Payer: Self-pay | Admitting: *Deleted

## 2010-12-03 MED ORDER — METFORMIN HCL ER (MOD) 500 MG PO TB24: 2000.0000 mg | ORAL_TABLET | Freq: Every day | ORAL | Status: DC

## 2010-12-20 ENCOUNTER — Other Ambulatory Visit: Payer: Self-pay | Admitting: *Deleted

## 2010-12-20 MED ORDER — METFORMIN HCL ER (MOD) 500 MG PO TB24: 2000.0000 mg | ORAL_TABLET | Freq: Every day | ORAL | Status: DC

## 2010-12-30 ENCOUNTER — Ambulatory Visit (INDEPENDENT_AMBULATORY_CARE_PROVIDER_SITE_OTHER): Payer: BC Managed Care – PPO | Admitting: Family Medicine

## 2010-12-30 ENCOUNTER — Encounter: Payer: Self-pay | Admitting: Family Medicine

## 2010-12-30 VITALS — BP 124/82 | HR 88 | Temp 97.6°F | Ht 73.0 in | Wt 266.0 lb

## 2010-12-30 DIAGNOSIS — L989 Disorder of the skin and subcutaneous tissue, unspecified: Secondary | ICD-10-CM

## 2010-12-30 NOTE — Progress Notes (Signed)
Benjamin Proctor, a 30 y.o. male presents today in the office for the following:    Knot, back of R leg  No trauma or injury No pain Just distal to the gastroc No pain at achilles Walking easily  The PMH, PSH, Social History, Family History, Medications, and allergies have been reviewed in [facility], and have been updated if relevant.  ROS: GEN: No acute illnesses, no fevers, chills. GI: No n/v/d, eating normally Pulm: No SOB Interactive and getting along well at home.  Otherwise, ROS is as per the HPI.   Physical Exam  Blood pressure 124/82, pulse 88, temperature 97.6 F (36.4 C), temperature source Oral, height 6\' 1"  (1.854 m), weight 266 lb (120.657 kg), SpO2 99.00%.  GEN: WDWN, NAD, Non-toxic, A & O x 3 HEENT: Atraumatic, Normocephalic. Neck supple. No masses, No LAD. Ears and Nose: No external deformity. EXTR: No c/c/e, palpable area about 1 1/2 cm across, non-fixed, movable post R leg. Neg Homan's sign NEURO Normal gait.  PSYCH: Normally interactive. Conversant. Not depressed or anxious appearing.  Calm demeanor.   Assessment and Plan: 1.  Leg area, lipoma vs cyst. Reassured. Would only follow at this point

## 2011-03-14 ENCOUNTER — Other Ambulatory Visit (INDEPENDENT_AMBULATORY_CARE_PROVIDER_SITE_OTHER): Payer: BC Managed Care – PPO

## 2011-03-14 DIAGNOSIS — E119 Type 2 diabetes mellitus without complications: Secondary | ICD-10-CM

## 2011-03-14 DIAGNOSIS — E785 Hyperlipidemia, unspecified: Secondary | ICD-10-CM

## 2011-03-14 LAB — LDL CHOLESTEROL, DIRECT: Direct LDL: 94.9 mg/dL

## 2011-03-14 LAB — LIPID PANEL
HDL: 45 mg/dL (ref >39.00)
Total CHOL/HDL Ratio: 4
Triglycerides: 221 mg/dL — ABNORMAL HIGH (ref 0.0–149.0)

## 2011-03-21 ENCOUNTER — Encounter: Payer: Self-pay | Admitting: Family Medicine

## 2011-03-21 ENCOUNTER — Ambulatory Visit (INDEPENDENT_AMBULATORY_CARE_PROVIDER_SITE_OTHER): Payer: BC Managed Care – PPO | Admitting: Family Medicine

## 2011-03-21 DIAGNOSIS — I10 Essential (primary) hypertension: Secondary | ICD-10-CM

## 2011-03-21 DIAGNOSIS — E119 Type 2 diabetes mellitus without complications: Secondary | ICD-10-CM

## 2011-03-21 DIAGNOSIS — E785 Hyperlipidemia, unspecified: Secondary | ICD-10-CM

## 2011-03-21 MED ORDER — GLIPIZIDE ER 5 MG PO TB24: 5.0000 mg | ORAL_TABLET | Freq: Every day | ORAL | Status: DC

## 2011-03-21 MED ORDER — ROSUVASTATIN CALCIUM 40 MG PO TABS: 40.0000 mg | ORAL_TABLET | Freq: Every day | ORAL | Status: DC

## 2011-03-21 NOTE — Progress Notes (Signed)
  Subjective:    Patient ID: Benjamin Proctor, male    DOB: 1981/04/15, 30 y.o.   MRN: [id number]  HPI  Benjamin Proctor, a 30 y.o. male presents today in the office for the following:    DM: watching what he is eating.  Diabetes Mellitus: Tolerating Medications: y Compliance with diet: mostly Exercise: rare walking Avg blood sugars at home: Foot problems: none Hypoglycemia: none No nausea, vomitting, blurred vision, polyuria.  Lab Results  Component Value Date   HGBA1C 9.1* 03/14/2011    Basic Metabolic Panel:    Component Value Date/Time   NA 140 03/29/2010 0832   K 4.1 03/29/2010 0832   CL 97 03/29/2010 0832   CO2 28 03/29/2010 0832   BUN 11 03/29/2010 0832   CREATININE 0.8 03/29/2010 0832   GLUCOSE 189* 03/29/2010 0832   CALCIUM 8.8 03/29/2010 0832   Lipids: Doing well, stable. Tolerating meds fine with no SE. Panel reviewed with patient.  Lipids:    Component Value Date/Time   CHOL 164 03/14/2011 0849   TRIG 221.0* 03/14/2011 0849   HDL 45.00 03/14/2011 0849   LDLDIRECT 94.9 03/14/2011 0849   VLDL 44.2* 03/14/2011 0849   CHOLHDL 4 03/14/2011 0849    Lab Results  Component Value Date   ALT 55* 03/29/2010   AST 34 03/29/2010   ALKPHOS 62 03/29/2010   BILITOT 1.0 03/29/2010   HTN: Tolerating all medications without side effects Stable and at goal No CP, no sob. No HA.  BP Readings from Last 3 Encounters:  03/21/11 120/82  12/30/10 124/82  11/16/10 130/90    Basic Metabolic Panel:    Component Value Date/Time   NA 140 03/29/2010 0832   K 4.1 03/29/2010 0832   CL 97 03/29/2010 0832   CO2 28 03/29/2010 0832   BUN 11 03/29/2010 0832   CREATININE 0.8 03/29/2010 0832   GLUCOSE 189* 03/29/2010 0832   CALCIUM 8.8 03/29/2010 0832       The PMH, PSH, Social History, Family History, Medications, and allergies have been reviewed in [facility], and have been updated if relevant.   Review of Systems ROS: GEN: No acute illnesses, no fevers, chills. GI: No  n/v/d, eating normally Pulm: No SOB Interactive and getting along well at home.  Otherwise, ROS is as per the HPI.     Objective:   Physical Exam   Physical Exam  Blood pressure 120/82, pulse 99, temperature 98.3 F (36.8 C), temperature source Oral, height 6\' 1"  (1.854 m), weight 264 lb 1.9 oz (119.804 kg), SpO2 98.00%.  GEN: WDWN, NAD, Non-toxic, A & O x 3 HEENT: Atraumatic, Normocephalic. Neck supple. No masses, No LAD. Ears and Nose: No external deformity. CV: RRR, No M/G/R. No JVD. No thrill. No extra heart sounds. PULM: CTA B, no wheezes, crackles, rhonchi. No retractions. No resp. distress. No accessory muscle use.  EXTR: No c/c/e NEURO Normal gait.  PSYCH: Normally interactive. Conversant. Not depressed or anxious appearing.  Calm demeanor.        Assessment & Plan:   1. HYPERLIPIDEMIA  rosuvastatin (CRESTOR) 40 MG tablet  2. DIABETES MELLITUS, TYPE II  glipiZIDE (GLUCOTROL XL) 5 MG 24 hr tablet  3. Hypertension      D/c zocor - change to crestor  Add glipizide and cont to work on diet and exercise

## 2011-03-21 NOTE — Patient Instructions (Signed)
F/u 3-4months

## 2011-03-25 ENCOUNTER — Telehealth: Payer: Self-pay | Admitting: *Deleted

## 2011-03-25 NOTE — Telephone Encounter (Signed)
Patient wife said crestor was not coverd by their insurance and the want to know what to do now

## 2011-03-28 NOTE — Telephone Encounter (Signed)
Would stay on zocor (simvistatin) and keep working on diet and exercising for the long term  Please correct med list back to zocor 40 mg

## 2011-03-28 NOTE — Telephone Encounter (Signed)
Patient wife advised and meds updated

## 2011-04-06 ENCOUNTER — Other Ambulatory Visit: Payer: Self-pay | Admitting: *Deleted

## 2011-04-06 MED ORDER — METFORMIN HCL ER (MOD) 500 MG PO TB24: 2000.0000 mg | ORAL_TABLET | Freq: Every day | ORAL | Status: DC

## 2011-06-21 ENCOUNTER — Ambulatory Visit: Payer: BC Managed Care – PPO | Admitting: Family Medicine

## 2011-06-22 ENCOUNTER — Ambulatory Visit: Payer: BC Managed Care – PPO | Admitting: Family Medicine

## 2011-07-14 ENCOUNTER — Ambulatory Visit: Payer: BC Managed Care – PPO | Admitting: Family Medicine

## 2011-07-21 ENCOUNTER — Encounter: Payer: Self-pay | Admitting: Family Medicine

## 2011-07-21 ENCOUNTER — Ambulatory Visit (INDEPENDENT_AMBULATORY_CARE_PROVIDER_SITE_OTHER): Payer: BC Managed Care – PPO | Admitting: Family Medicine

## 2011-07-21 DIAGNOSIS — I10 Essential (primary) hypertension: Secondary | ICD-10-CM

## 2011-07-21 DIAGNOSIS — E669 Obesity, unspecified: Secondary | ICD-10-CM

## 2011-07-21 DIAGNOSIS — E785 Hyperlipidemia, unspecified: Secondary | ICD-10-CM

## 2011-07-21 DIAGNOSIS — E119 Type 2 diabetes mellitus without complications: Secondary | ICD-10-CM

## 2011-07-21 NOTE — Progress Notes (Signed)
  Patient Name: Benjamin Proctor Date of Birth: 12-09-1980 Age: 31 y.o. Medical Record Number: [id number] Gender: male Date of Encounter: 07/21/2011  History of Present Illness:  Benjamin Proctor is a 31 y.o. very pleasant male patient who presents with the following:  Watching.  DM: 130-138 at the high.  No thirst.  Wt Readings from Last 3 Encounters:  07/21/11 262 lb 1.9 oz (118.897 kg)  03/21/11 264 lb 1.9 oz (119.804 kg)  12/30/10 266 lb (120.657 kg)   Diabetes Mellitus: Tolerating Medications: yes Compliance with diet: better Exercise: rare Avg blood sugars at home: 100-130 Foot problems: none Hypoglycemia: none No nausea, vomitting, blurred vision, polyuria. No eye exam  Lab Results  Component Value Date   HGBA1C 9.1* 03/14/2011    Wt Readings from Last 3 Encounters:  07/21/11 262 lb 1.9 oz (118.897 kg)  03/21/11 264 lb 1.9 oz (119.804 kg)  12/30/10 266 lb (120.657 kg)    Body mass index is 34.58 kg/(m^2).  Lipids: Doing well, stable. Tolerating meds fine with no SE. Panel reviewed with patient.  Lipids:    Component Value Date/Time   CHOL 164 03/14/2011 0849   TRIG 221.0* 03/14/2011 0849   HDL 45.00 03/14/2011 0849   LDLDIRECT 94.9 03/14/2011 0849   VLDL 44.2* 03/14/2011 0849   CHOLHDL 4 03/14/2011 0849    Lab Results  Component Value Date   ALT 55* 03/29/2010   AST 34 03/29/2010   ALKPHOS 62 03/29/2010   BILITOT 1.0 03/29/2010   HTN: Tolerating all medications without side effects Stable and at goal No CP, no sob. No HA.  BP Readings from Last 3 Encounters:  07/21/11 120/84  03/21/11 120/82  12/30/10 124/82    Basic Metabolic Panel:    Component Value Date/Time   NA 140 03/29/2010 0832   K 4.1 03/29/2010 0832   CL 97 03/29/2010 0832   CO2 28 03/29/2010 0832   BUN 11 03/29/2010 0832   CREATININE 0.8 03/29/2010 0832   GLUCOSE 189* 03/29/2010 0832   CALCIUM 8.8 03/29/2010 0832    Past Medical History, Surgical History, Social History,  Family History, Problem List, Medications, and Allergies have been reviewed and updated if relevant.  Review of Systems:  GEN: No acute illnesses, no fevers, chills. GI: No n/v/d, eating normally Pulm: No SOB Interactive and getting along well at home.  Otherwise, ROS is as per the HPI.   Physical Examination: Filed Vitals:   07/21/11 1607  BP: 120/84  Pulse: 93  Temp: 97.8 F (36.6 C)  TempSrc: Oral  Height: 6\' 1"  (1.854 m)  Weight: 262 lb 1.9 oz (118.897 kg)  SpO2: 98%    Body mass index is 34.58 kg/(m^2).   GEN: WDWN, NAD, Non-toxic, A & O x 3 HEENT: Atraumatic, Normocephalic. Neck supple. No masses, No LAD. Ears and Nose: No external deformity. CV: RRR, No M/G/R. No JVD. No thrill. No extra heart sounds. PULM: CTA B, no wheezes, crackles, rhonchi. No retractions. No resp. distress. No accessory muscle use. EXTR: No c/c/e NEURO Normal gait.  PSYCH: Normally interactive. Conversant. Not depressed or anxious appearing.  Calm demeanor.    Assessment and Plan: 1. DIABETES MELLITUS, TYPE II  Hemoglobin A1c, Microalbumin / creatinine urine ratio  2. HYPERLIPIDEMIA  LDL cholesterol, direct  3. Hypertension  Basic metabolic panel  Obesity  Stable, check all --- adjust meds if needed based on labs

## 2011-07-22 LAB — BASIC METABOLIC PANEL
CO2: 28 mEq/L (ref 19–32)
Calcium: 9.6 mg/dL (ref 8.4–10.5)
Glucose, Bld: 93 mg/dL (ref 70–99)
Sodium: 137 mEq/L (ref 135–145)

## 2011-07-22 LAB — MICROALBUMIN / CREATININE URINE RATIO: Microalb Creat Ratio: 0.7 mg/g (ref 0.0–30.0)

## 2011-10-19 ENCOUNTER — Other Ambulatory Visit: Payer: Self-pay | Admitting: Family Medicine

## 2011-11-16 ENCOUNTER — Other Ambulatory Visit: Payer: Self-pay | Admitting: *Deleted

## 2011-11-16 MED ORDER — LOSARTAN POTASSIUM 50 MG PO TABS: 50.0000 mg | ORAL_TABLET | Freq: Every day | ORAL | Status: DC

## 2011-11-16 NOTE — Telephone Encounter (Signed)
Received faxed refill request from pharmacy. See allergy/contraindication. Is it okay to refill medication? 

## 2011-11-16 NOTE — Telephone Encounter (Signed)
Yes, ace cough, not angioedema.   #30, 5 refills

## 2011-12-24 ENCOUNTER — Other Ambulatory Visit: Payer: Self-pay | Admitting: Family Medicine

## 2012-01-25 ENCOUNTER — Encounter: Payer: Self-pay | Admitting: Family Medicine

## 2012-01-25 ENCOUNTER — Ambulatory Visit (INDEPENDENT_AMBULATORY_CARE_PROVIDER_SITE_OTHER): Payer: BC Managed Care – PPO | Admitting: Family Medicine

## 2012-01-25 VITALS — BP 122/80 | HR 88 | Temp 98.2°F | Wt 259.5 lb

## 2012-01-25 DIAGNOSIS — N476 Balanoposthitis: Secondary | ICD-10-CM

## 2012-01-25 DIAGNOSIS — R300 Dysuria: Secondary | ICD-10-CM

## 2012-01-25 DIAGNOSIS — N481 Balanitis: Secondary | ICD-10-CM

## 2012-01-25 LAB — POCT URINALYSIS DIPSTICK
Bilirubin, UA: NEGATIVE
Glucose, UA: NEGATIVE
Spec Grav, UA: 1.02

## 2012-01-25 MED ORDER — FLUCONAZOLE 150 MG PO TABS: 150.0000 mg | ORAL_TABLET | Freq: Once | ORAL | Status: AC

## 2012-01-25 MED ORDER — DOXYCYCLINE HYCLATE 100 MG PO TABS: 100.0000 mg | ORAL_TABLET | Freq: Two times a day (BID) | ORAL | Status: DC

## 2012-01-25 MED ORDER — METRONIDAZOLE 500 MG PO TABS: 500.0000 mg | ORAL_TABLET | Freq: Two times a day (BID) | ORAL | Status: AC

## 2012-01-25 MED ORDER — BACITRACIN 500 UNIT/GM EX OINT: 1.0000 "application " | TOPICAL_OINTMENT | Freq: Two times a day (BID) | CUTANEOUS | Status: AC

## 2012-01-25 NOTE — Patient Instructions (Addendum)
CLOTRIMAZOLE CREAM, APPLY TO INSIDE OF FORESKIN / TIP OF PENIS, 2-3 TIMES A DAY UNTIL BETTER, THEN KEEP DOING IT FOR 3 OR 4 DAYS

## 2012-01-25 NOTE — Progress Notes (Signed)
Nature conservation officer at [facility] [address] [place] [address] Phone: [phone] Fax: [phone]  Date:  01/25/2012   Name:  Benjamin Proctor   DOB:  [birth year]   MRN:  [id number] Gender: male Age: 31 y.o.  PCP:  [name], MD    Chief Complaint: penis swollen and sore   History of Present Illness:  Benjamin Proctor is a 31 y.o. very pleasant male patient who presents with the following:  Painful with urination.   Very nice uncircumcised male with a history of difficult to control diabetes who presents with one month history of worsening dysuria, swelling in his foreskin and pain in and about the distal aspect of the tip of his pain his. His foreskin was not retractable this point and is hurting him constantly and severely. This is the first time he has sought medical attention.  Past Medical History, Surgical History, Social History, Family History, Problem List, Medications, and Allergies have been reviewed and updated if relevant.  Current Outpatient Prescriptions on File Prior to Visit  Medication Sig Dispense Refill  . fish oil-omega-3 fatty acids 1000 MG capsule Take 1 g by mouth daily.        Marland Kitchen glipiZIDE (GLUCOTROL XL) 5 MG 24 hr tablet Take 1 tablet (5 mg total) by mouth daily.  90 tablet  3  . glucose blood test strip 1 each by Other route 2 (two) times daily. Use as instructed       . Lancets MISC Check blood sugar 2 times daily       . losartan (COZAAR) 50 MG tablet Take 1 tablet (50 mg total) by mouth daily.  30 tablet  5  . losartan (COZAAR) 50 MG tablet TAKE 1 TABLET (50 MG TOTAL) BY MOUTH DAILY.  30 tablet  10  . metFORMIN (GLUMETZA) 500 MG (MOD) 24 hr tablet Take 4 tablets (2,000 mg total) by mouth daily.  120 tablet  11  . simvastatin (ZOCOR) 40 MG tablet Take 40 mg by mouth at bedtime.        . simvastatin (ZOCOR) 40 MG tablet TAKE 1 TABLET (40 MG TOTAL) BY MOUTH AT BEDTIME.  30 tablet  10    Review of Systems: As above. No fever, chills, sweats  or chest pain  Physical Examination: Filed Vitals:   01/25/12 1104  BP: 122/80  Pulse: 88  Temp: 98.2 F (36.8 C)  TempSrc: Oral  Weight: 259 lb 8 oz (117.708 kg)    Ideal Body Weight:     GEN: WDWN, NAD, Non-toxic, Alert & Oriented x 3 HEENT: Atraumatic, Normocephalic.  Ears and Nose: No external deformity. EXTR: No clubbing/cyanosis/edema NEURO: Normal gait.  PSYCH: Normally interactive. Conversant. Not depressed or anxious appearing.  Calm demeanor.  GU: Penile shaft is nontender. Quite swollen unretractable foreskin  Assessment and Plan:  1. Dysuria  POCT urinalysis dipstick, Urine culture  2. Balanitis  fluconazole (DIFLUCAN) 150 MG tablet, bacitracin 500 UNIT/GM ointment, metroNIDAZOLE (FLAGYL) 500 MG tablet, doxycycline (VIBRA-TABS) 100 MG tablet   Appears to be a very severe case of balanitis, and the patient did not seek care for several weeks. Reviewed preferred treatment protocols from [facility] and went with these suggestions. He will call if not improved in a week  Orders Today:  Orders Placed This Encounter  Procedures  . Urine culture  . POCT urinalysis dipstick    Medications Today: (Includes new updates added during medication reconciliation) Meds ordered this encounter  Medications  .  fluconazole (DIFLUCAN) 150 MG tablet    Sig: Take 1 tablet (150 mg total) by mouth once.    Dispense:  1 tablet    Refill:  0  . bacitracin 500 UNIT/GM ointment    Sig: Apply 1 application topically 2 (two) times daily.    Dispense:  15 g    Refill:  0  . metroNIDAZOLE (FLAGYL) 500 MG tablet    Sig: Take 1 tablet (500 mg total) by mouth 2 (two) times daily.    Dispense:  14 tablet    Refill:  0  . doxycycline (VIBRA-TABS) 100 MG tablet    Sig: Take 1 tablet (100 mg total) by mouth 2 (two) times daily.    Dispense:  28 tablet    Refill:  0    Medications Discontinued: Medications Discontinued During This Encounter  Medication Reason  . losartan (COZAAR)  50 MG tablet Duplicate  . simvastatin (ZOCOR) 40 MG tablet Duplicate     [name], MD

## 2012-01-27 ENCOUNTER — Emergency Department: Payer: Self-pay | Admitting: Emergency Medicine

## 2012-01-27 LAB — URINALYSIS, COMPLETE
Bilirubin,UR: NEGATIVE
Glucose,UR: NEGATIVE mg/dL (ref 0–75)
Nitrite: NEGATIVE
Ph: 5 (ref 4.5–8.0)
RBC,UR: 37 /HPF (ref 0–5)
Squamous Epithelial: 1

## 2012-01-27 LAB — BASIC METABOLIC PANEL
BUN: 19 mg/dL — ABNORMAL HIGH (ref 7–18)
Creatinine: 1.1 mg/dL (ref 0.60–1.30)
EGFR (Non-African Amer.): >60
Glucose: 113 mg/dL — ABNORMAL HIGH (ref 65–99)
Osmolality: 282 (ref 275–301)
Potassium: 3.7 mmol/L (ref 3.5–5.1)
Sodium: 140 mmol/L (ref 136–145)

## 2012-01-27 LAB — CBC
HCT: 39.8 % — ABNORMAL LOW (ref 40.0–52.0)
HGB: 13.6 g/dL (ref 13.0–18.0)
MCH: 28.9 pg (ref 26.0–34.0)
Platelet: 268 10*3/uL (ref 150–440)
RBC: 4.69 10*6/uL (ref 4.40–5.90)

## 2012-01-29 LAB — URINE CULTURE: Colony Count: 65000

## 2012-02-01 ENCOUNTER — Telehealth: Payer: Self-pay | Admitting: Internal Medicine

## 2012-02-01 ENCOUNTER — Encounter: Payer: Self-pay | Admitting: Internal Medicine

## 2012-02-01 DIAGNOSIS — N390 Urinary tract infection, site not specified: Secondary | ICD-10-CM

## 2012-02-01 DIAGNOSIS — N4889 Other specified disorders of penis: Secondary | ICD-10-CM

## 2012-02-01 DIAGNOSIS — N481 Balanitis: Secondary | ICD-10-CM

## 2012-02-01 MED ORDER — FLUCONAZOLE 150 MG PO TABS: 150.0000 mg | ORAL_TABLET | Freq: Every day | ORAL | Status: AC

## 2012-02-01 MED ORDER — LEVOFLOXACIN 500 MG PO TABS: 500.0000 mg | ORAL_TABLET | Freq: Every day | ORAL | Status: AC

## 2012-02-01 NOTE — Telephone Encounter (Signed)
I spoke with patient and he stated he is still swollen and still in pain.  He said now when he urinates there is a white film that comes out of his penis, and he is still unable to pull the foreskin back.   He requested a note to be out of work for the rest of the week because he sometimes can not make it to the restroom and he does a lot of heavy lifting.  Please advise.

## 2012-02-01 NOTE — Telephone Encounter (Signed)
Opened in error

## 2012-02-01 NOTE — Telephone Encounter (Signed)
Message copied by Jazmyne Beauchesne A on Wed Feb 01, 2012  1:13 PM ------      Message from: COPLAND, SPENCER      Created: Tue Jan 31, 2012  1:08 PM       Can you call patient and make sure he is feeling better. His urine culture positive, but with a lower amount of bacteria --- hard to know if this is from the urinary tract or contaminant from the penis itself.            But, I mainly want to know if he is feeling better, that is all that matters. Pain and swelling should be improving.            If still pain with urination, urgency, then I want to change antibiotics some. 

## 2012-02-01 NOTE — Telephone Encounter (Signed)
Call please  Have him stop the doxycycline  Start levaquin 500 mg, 1 po daily, #7 Start diflucan 150 mg, 1 po daily, #7  I would like him to see urology this week --- concerning that he is not better.

## 2012-02-01 NOTE — Telephone Encounter (Signed)
Message copied by Edd Fabian on Wed Feb 01, 2012  1:13 PM ------      Message from: [name]      Created: Tue Jan 31, 2012  1:08 PM       Can you call patient and make sure he is feeling better. His urine culture positive, but with a lower amount of bacteria --- hard to know if this is from the urinary tract or contaminant from the penis itself.            But, I mainly want to know if he is feeling better, that is all that matters. Pain and swelling should be improving.            If still pain with urination, urgency, then I want to change antibiotics some.

## 2012-02-01 NOTE — Telephone Encounter (Signed)
Benjamin Proctor, he made his own urology appt.  Agree   [name], MD 02/01/2012, 2:32 PM

## 2012-02-01 NOTE — Addendum Note (Signed)
Addended by: Darletta Moll A on: 02/01/2012 02:23 PM   Modules accepted: Orders

## 2012-02-01 NOTE — Telephone Encounter (Signed)
Patient notified.  He stated he has an appt with urology on Friday and plans to keep that appt.  Rxs have been called in.  He asked again about obtaining the work note excusing him from work this week.  Is this ok?

## 2012-02-07 ENCOUNTER — Ambulatory Visit: Payer: Self-pay | Admitting: Urology

## 2012-02-10 LAB — PATHOLOGY REPORT

## 2012-02-22 ENCOUNTER — Other Ambulatory Visit: Payer: Self-pay | Admitting: Family Medicine

## 2012-02-23 ENCOUNTER — Other Ambulatory Visit: Payer: Self-pay

## 2012-02-23 DIAGNOSIS — E119 Type 2 diabetes mellitus without complications: Secondary | ICD-10-CM

## 2012-02-23 MED ORDER — GLIPIZIDE ER 5 MG PO TB24: 5.0000 mg | ORAL_TABLET | Freq: Every day | ORAL | Status: DC

## 2012-02-23 NOTE — Telephone Encounter (Signed)
Refill request  for Glipizide 5 mg #90 3 R. Sent to [facility].

## 2012-05-04 ENCOUNTER — Other Ambulatory Visit: Payer: Self-pay | Admitting: Family Medicine

## 2012-06-17 ENCOUNTER — Other Ambulatory Visit: Payer: Self-pay | Admitting: Family Medicine

## 2012-10-03 ENCOUNTER — Other Ambulatory Visit: Payer: Self-pay | Admitting: Family Medicine

## 2012-11-14 ENCOUNTER — Other Ambulatory Visit: Payer: Self-pay | Admitting: Family Medicine

## 2012-12-17 ENCOUNTER — Other Ambulatory Visit: Payer: Self-pay | Admitting: Family Medicine

## 2012-12-17 NOTE — Telephone Encounter (Signed)
Received refill request from pharmacy. Last office visit 01/25/12. Last lipid panel 03/14/11. Last refill request on 11/14/12 it was noted that patient needed to be seen for further refills. No appointments scheduled.

## 2012-12-17 NOTE — Telephone Encounter (Signed)
Refill 30, 1 refill  Schedule 30 min CPX

## 2012-12-18 NOTE — Telephone Encounter (Signed)
Left message for patient to call me and schedule appt will not refill medication until patient returns my call

## 2012-12-18 NOTE — Telephone Encounter (Signed)
Left message for patient to return my call.

## 2012-12-20 NOTE — Telephone Encounter (Signed)
appt scheduled for 01-14-2013 and medications refilled

## 2013-01-14 ENCOUNTER — Ambulatory Visit (INDEPENDENT_AMBULATORY_CARE_PROVIDER_SITE_OTHER): Payer: BC Managed Care – PPO | Admitting: Family Medicine

## 2013-01-14 ENCOUNTER — Encounter: Payer: Self-pay | Admitting: Family Medicine

## 2013-01-14 ENCOUNTER — Encounter: Payer: Self-pay | Admitting: *Deleted

## 2013-01-14 VITALS — BP 118/74 | HR 78 | Temp 98.2°F | Ht 73.0 in | Wt 266.5 lb

## 2013-01-14 DIAGNOSIS — E119 Type 2 diabetes mellitus without complications: Secondary | ICD-10-CM

## 2013-01-14 DIAGNOSIS — Z0000 Encounter for general adult medical examination without abnormal findings: Secondary | ICD-10-CM

## 2013-01-14 DIAGNOSIS — Z79899 Other long term (current) drug therapy: Secondary | ICD-10-CM

## 2013-01-14 DIAGNOSIS — B690 Cysticercosis of central nervous system: Secondary | ICD-10-CM

## 2013-01-14 DIAGNOSIS — E785 Hyperlipidemia, unspecified: Secondary | ICD-10-CM

## 2013-01-14 DIAGNOSIS — B699 Cysticercosis, unspecified: Secondary | ICD-10-CM

## 2013-01-14 DIAGNOSIS — I10 Essential (primary) hypertension: Secondary | ICD-10-CM

## 2013-01-14 LAB — BASIC METABOLIC PANEL
GFR: 97.6 mL/min (ref >60.00)
Glucose, Bld: 101 mg/dL — ABNORMAL HIGH (ref 70–99)
Potassium: 4.3 mEq/L (ref 3.5–5.1)
Sodium: 137 mEq/L (ref 135–145)

## 2013-01-14 LAB — HEPATIC FUNCTION PANEL
ALT: 34 U/L (ref 0–53)
AST: 17 U/L (ref 0–37)
Albumin: 3.9 g/dL (ref 3.5–5.2)
Alkaline Phosphatase: 43 U/L (ref 39–117)
Total Protein: 6.9 g/dL (ref 6.0–8.3)

## 2013-01-14 LAB — CBC WITH DIFFERENTIAL/PLATELET
Basophils Absolute: 0 10*3/uL (ref 0.0–0.1)
Basophils Relative: 0.4 % (ref 0.0–3.0)
Eosinophils Relative: 1.7 % (ref 0.0–5.0)
HCT: 43.9 % (ref 39.0–52.0)
Hemoglobin: 15.2 g/dL (ref 13.0–17.0)
Lymphs Abs: 2.3 10*3/uL (ref 0.7–4.0)
Monocytes Relative: 10.2 % (ref 3.0–12.0)
Neutro Abs: 2.1 10*3/uL (ref 1.4–7.7)
RBC: 5.08 Mil/uL (ref 4.22–5.81)
RDW: 12.8 % (ref 11.5–14.6)

## 2013-01-14 LAB — LIPID PANEL
HDL: 34.3 mg/dL — ABNORMAL LOW (ref >39.00)
LDL Cholesterol: 84 mg/dL (ref 0–99)
Total CHOL/HDL Ratio: 4
Triglycerides: 106 mg/dL (ref 0.0–149.0)

## 2013-01-14 LAB — MICROALBUMIN / CREATININE URINE RATIO: Microalb, Ur: 0.5 mg/dL (ref 0.0–1.9)

## 2013-01-14 LAB — HEMOGLOBIN A1C: Hgb A1c MFr Bld: 6 % (ref 4.6–6.5)

## 2013-01-14 NOTE — Progress Notes (Signed)
Nature conservation officer at [facility] [address] [place] [address] Phone: [phone] Fax: [phone]  Date:  01/14/2013   Name:  Benjamin Proctor   DOB:  05-15-1981   MRN:  [id number] Gender: male Age: 32 y.o.  Primary Physician:  [name], MD  Evaluating MD: [name], MD   Chief Complaint: Annual Exam   History of Present Illness:  Benjamin Proctor is a 32 y.o. pleasant patient who presents with the following:  CPX:  Wt Readings from Last 3 Encounters:  01/14/13 266 lb 8 oz (120.884 kg)  01/25/12 259 lb 8 oz (117.708 kg)  07/21/11 262 lb 1.9 oz (118.897 kg)   cistercercosis (pork)  16-17 yo.  Working 7 days, but has been going to [employer].  Sometimes smokes, 3 weeks in a pack.   Preventative Health Maintenance Visit:  Health Maintenance Summary Reviewed and updated, unless pt declines services.  Tobacco History Reviewed. Rare smoker, 1 pack every 2 weeks Alcohol: No concerns, no excessive use Exercise Habits: minimal now STD concerns: no risk or activity to increase risk Drug Use: None Encouraged self-testicular check  Health Maintenance  Topic Date Due  . Ophthalmology Exam  01/15/2014  . Tetanus/tdap  01/15/2014  . Influenza Vaccine  01/28/2013  . Hemoglobin A1c  07/17/2013  . Urine Microalbumin  01/14/2014  . Foot Exam  01/15/2014  . Pneumococcal Polysaccharide Vaccine (#2) 03/30/2015    Labs reviewed with the patient.  Results for orders placed in visit on 01/14/13  HEMOGLOBIN A1C      Result Value Range   Hemoglobin A1C 6.0  4.6 - 6.5 %  BASIC METABOLIC PANEL      Result Value Range   Sodium 137  135 - 145 mEq/L   Potassium 4.3  3.5 - 5.1 mEq/L   Chloride 103  96 - 112 mEq/L   CO2 26  19 - 32 mEq/L   Glucose, Bld 101 (*) 70 - 99 mg/dL   BUN 11  6 - 23 mg/dL   Creatinine, Ser 1.0  0.4 - 1.5 mg/dL   Calcium 8.8  8.4 - 03.4 mg/dL   GFR 74.25  >95.63 mL/min  CBC WITH DIFFERENTIAL      Result Value Range   WBC 4.9   4.5 - 10.5 K/uL   RBC 5.08  4.22 - 5.81 Mil/uL   Hemoglobin 15.2  13.0 - 17.0 g/dL   HCT 87.5  64.3 - 32.9 %   MCV 86.5  78.0 - 100.0 fl   MCHC 34.6  30.0 - 36.0 g/dL   RDW 51.8  84.1 - 66.0 %   Platelets 232.0  150.0 - 400.0 K/uL   Neutrophils Relative % 41.7 (*) 43.0 - 77.0 %   Lymphocytes Relative 46.0  12.0 - 46.0 %   Monocytes Relative 10.2  3.0 - 12.0 %   Eosinophils Relative 1.7  0.0 - 5.0 %   Basophils Relative 0.4  0.0 - 3.0 %   Neutro Abs 2.1  1.4 - 7.7 K/uL   Lymphs Abs 2.3  0.7 - 4.0 K/uL   Monocytes Absolute 0.5  0.1 - 1.0 K/uL   Eosinophils Absolute 0.1  0.0 - 0.7 K/uL   Basophils Absolute 0.0  0.0 - 0.1 K/uL  HEPATIC FUNCTION PANEL      Result Value Range   Total Bilirubin 0.8  0.3 - 1.2 mg/dL   Bilirubin, Direct 0.0  0.0 - 0.3 mg/dL   Alkaline Phosphatase 43  39 -  117 U/L   AST 17  0 - 37 U/L   ALT 34  0 - 53 U/L   Total Protein 6.9  6.0 - 8.3 g/dL   Albumin 3.9  3.5 - 5.2 g/dL  MICROALBUMIN / CREATININE URINE RATIO      Result Value Range   Microalb, Ur 0.5  0.0 - 1.9 mg/dL   Creatinine,U 161.1     Microalb Creat Ratio 0.3  0.0 - 30.0 mg/g  LIPID PANEL      Result Value Range   Cholesterol 139  0 - 200 mg/dL   Triglycerides 096.0  0.0 - 149.0 mg/dL   HDL 45.40 (*) >98.11 mg/dL   VLDL 91.4  0.0 - 78.2 mg/dL   LDL Cholesterol 84  0 - 99 mg/dL   Total CHOL/HDL Ratio 4       Patient Active Problem List   Diagnosis Date Noted  . Hypertension 10/01/2010  . HYPERLIPIDEMIA 01/15/2009  . DIABETES MELLITUS, TYPE II 12/16/2008  . OBESITY 12/16/2008  . 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 GERD 12/16/2008    Past Medical History  Diagnosis Date  . GERD (gastroesophageal reflux disease)   . Seizures   . Diabetes mellitus   . Hyperlipidemia   . Hypogonadism male   . Obesity   . Hypertension 10/01/2010    No past surgical history on file.  History   Social History  . Marital Status: Married    Spouse Name: N/A    Number of Children: N/A  .  Years of Education: N/A   Occupational History  . Not on file.   Social History Main Topics  . Smoking status: Current Some Day Smoker    Types: Cigarettes  . Smokeless tobacco: Never Used  . Alcohol Use: No  . Drug Use: No  . Sexual Activity: Not on file   Other Topics Concern  . Not on file   Social History Narrative  . No narrative on file    Family History  Problem Relation Age of Onset  . Diabetes type II Father     Allergies  Allergen Reactions  . Ace Inhibitors Cough  . Carbamazepine   . Penicillins   . Phenytoin     Medication list has been reviewed and updated.  Outpatient Prescriptions Prior to Visit  Medication Sig Dispense Refill  . fish oil-omega-3 fatty acids 1000 MG capsule Take 1 g by mouth daily.        Marland Kitchen glipiZIDE (GLUCOTROL XL) 5 MG 24 hr tablet Take 1 tablet (5 mg total) by mouth daily.  90 tablet  3  . glucose blood test strip 1 each by Other route 2 (two) times daily. Use as instructed       . Lancets MISC Check blood sugar 2 times daily       . losartan (COZAAR) 50 MG tablet TAKE 1 TABLET (50 MG TOTAL) BY MOUTH DAILY.  30 tablet  1  . metFORMIN (GLUCOPHAGE-XR) 500 MG 24 hr tablet TAKE 4 TABLETS (2,000 MG TOTAL) BY MOUTH DAILY.  120 tablet  10  . simvastatin (ZOCOR) 40 MG tablet TAKE 1 TABLET (40 MG TOTAL) BY MOUTH AT BEDTIME.  30 tablet  1  . simvastatin (ZOCOR) 40 MG tablet Take 40 mg by mouth at bedtime.        . simvastatin (ZOCOR) 40 MG tablet TAKE 1 TABLET (40 MG TOTAL) BY MOUTH AT BEDTIME.  30 tablet  9   No facility-administered medications prior  to visit.    Review of Systems:   General: Denies fever, chills, sweats. No significant weight loss. Eyes: Denies blurring,significant itching ENT: Denies earache, sore throat, and hoarseness. Cardiovascular: Denies chest pains, palpitations, dyspnea on exertion Respiratory: Denies cough, dyspnea at rest,wheeezing Breast: no concerns about lumps GI: Denies nausea, vomiting, diarrhea,  constipation, change in bowel habits, abdominal pain, melena, hematochezia GU: Denies penile discharge, ED, urinary flow / outflow problems. No STD concerns. LAST SPRING THE PATIENT HAD A CASE OF SEVERE BALANITIS REQUIRING CIRCUMCISION AND NOW HAVING TROUBLE WITH EJACULATION Musculoskeletal: Denies back pain, joint pain Derm: Denies rash, itching Neuro: Denies  paresthesias, frequent falls, frequent headaches Psych: Denies depression, anxiety Endocrine: Denies cold intolerance, heat intolerance, polydipsia Heme: Denies enlarged lymph nodes Allergy: No hayfever   Physical Examination: BP 118/74  Pulse 78  Temp(Src) 98.2 F (36.8 C) (Oral)  Ht 6\' 1"  (1.854 m)  Wt 266 lb 8 oz (120.884 kg)  BMI 35.17 kg/m2  Ideal Body Weight: Weight in (lb) to have BMI = 25: 189.1  GEN: well developed, well nourished, no acute distress Eyes: conjunctiva and lids normal, PERRLA, EOMI ENT: TM clear, nares clear, oral exam WNL Neck: supple, no lymphadenopathy, no thyromegaly, no JVD Pulm: clear to auscultation and percussion, respiratory effort normal CV: regular rate and rhythm, S1-S2, no murmur, rub or gallop, no bruits, peripheral pulses normal and symmetric, no cyanosis, clubbing, edema or varicosities GI: soft, non-tender; no hepatosplenomegaly, masses; active bowel sounds all quadrants GU: no hernia, testicular mass, penile discharge Lymph: no cervical, axillary or inguinal adenopathy MSK: gait normal, muscle tone and strength WNL, no joint swelling, effusions, discoloration, crepitus  SKIN: clear, good turgor, color WNL, no rashes, lesions, or ulcerations Neuro: normal mental status, normal strength, sensation, and motion Psych: alert; oriented to person, place and time, normally interactive and not anxious or depressed in appearance.  Diabetic foot exam: Normal inspection No skin breakdown - ? Mild athletes foot No calluses  Normal DP pulses Normal sensation to light tough Nails normal    Assessment and Plan:  Routine general medical examination at a health care facility  DIABETES MELLITUS, TYPE II - Plan: Hemoglobin A1c, Basic metabolic panel, Microalbumin / creatinine urine ratio  Hypertension  HYPERLIPIDEMIA - Plan: Lipid panel  Encounter for long-term (current) use of other medications - Plan: CBC with Differential, Hepatic function panel  Neurocysticercosis  The patient's preventative maintenance and recommended screening tests for an annual wellness exam were reviewed in full today. Brought up to date unless services declined.  Counselled on the importance of diet, exercise, and its role in overall health and mortality. The patient's FH and SH was reviewed, including their home life, tobacco status, and drug and alcohol status.   DM and HTN doing much better. Discussed altered sensation, recommended f/u with his urologist. There may be a form of rehab that could help.  Orders Today:  Orders Placed This Encounter  Procedures  . Hemoglobin A1c  . Basic metabolic panel  . CBC with Differential  . Hepatic function panel  . Microalbumin / creatinine urine ratio  . Lipid panel    Updated Medication List: (Includes new medications, updates to list, dose adjustments) No orders of the defined types were placed in this encounter.    Medications Discontinued: Medications Discontinued During This Encounter  Medication Reason  . simvastatin (ZOCOR) 40 MG tablet Duplicate  . simvastatin (ZOCOR) 40 MG tablet       Signed, Harol Shabazz T. Lisseth Brazeau, MD 01/14/2013 9:20  AM  

## 2013-01-15 ENCOUNTER — Encounter: Payer: Self-pay | Admitting: Family Medicine

## 2013-01-15 DIAGNOSIS — B690 Cysticercosis of central nervous system: Secondary | ICD-10-CM | POA: Insufficient documentation

## 2013-03-06 ENCOUNTER — Other Ambulatory Visit: Payer: Self-pay | Admitting: Family Medicine

## 2013-06-01 ENCOUNTER — Other Ambulatory Visit: Payer: Self-pay | Admitting: Family Medicine

## 2013-09-27 ENCOUNTER — Other Ambulatory Visit: Payer: Self-pay | Admitting: Family Medicine

## 2013-09-27 NOTE — Telephone Encounter (Signed)
Last office visit 01/14/2013.  Last A1c 01/14/2013. No future appointments scheduled. Ok to refill?

## 2013-09-28 NOTE — Telephone Encounter (Signed)
Ok to refill 30, 2 refills  F/u diabetes recheck in next month or so

## 2013-10-07 ENCOUNTER — Other Ambulatory Visit (INDEPENDENT_AMBULATORY_CARE_PROVIDER_SITE_OTHER): Payer: BC Managed Care – PPO

## 2013-10-07 DIAGNOSIS — E119 Type 2 diabetes mellitus without complications: Secondary | ICD-10-CM

## 2013-10-07 LAB — BASIC METABOLIC PANEL
BUN: 11 mg/dL (ref 6–23)
CALCIUM: 9.2 mg/dL (ref 8.4–10.5)
CO2: 28 meq/L (ref 19–32)
Chloride: 104 mEq/L (ref 96–112)
Creatinine, Ser: 0.9 mg/dL (ref 0.4–1.5)
GFR: 108.98 mL/min (ref >60.00)
GLUCOSE: 116 mg/dL — AB (ref 70–99)
POTASSIUM: 4.5 meq/L (ref 3.5–5.1)
SODIUM: 137 meq/L (ref 135–145)

## 2013-10-07 LAB — HEMOGLOBIN A1C: Hgb A1c MFr Bld: 6.7 % — ABNORMAL HIGH (ref 4.6–6.5)

## 2013-10-07 LAB — MICROALBUMIN / CREATININE URINE RATIO
Creatinine,U: 137.8 mg/dL
MICROALB/CREAT RATIO: 0.3 mg/g (ref 0.0–30.0)
Microalb, Ur: 0.4 mg/dL (ref 0.0–1.9)

## 2013-10-10 ENCOUNTER — Ambulatory Visit (INDEPENDENT_AMBULATORY_CARE_PROVIDER_SITE_OTHER): Payer: BC Managed Care – PPO | Admitting: Family Medicine

## 2013-10-10 ENCOUNTER — Encounter: Payer: Self-pay | Admitting: Family Medicine

## 2013-10-10 VITALS — BP 144/90 | HR 85 | Temp 98.2°F | Ht 73.0 in | Wt 268.8 lb

## 2013-10-10 DIAGNOSIS — E785 Hyperlipidemia, unspecified: Secondary | ICD-10-CM

## 2013-10-10 DIAGNOSIS — I10 Essential (primary) hypertension: Secondary | ICD-10-CM

## 2013-10-10 DIAGNOSIS — Z23 Encounter for immunization: Secondary | ICD-10-CM

## 2013-10-10 DIAGNOSIS — E119 Type 2 diabetes mellitus without complications: Secondary | ICD-10-CM

## 2013-10-10 NOTE — Progress Notes (Signed)
Pre visit review using our clinic review tool, if applicable. No additional management support is needed unless otherwise documented below in the visit note. 

## 2013-10-10 NOTE — Progress Notes (Signed)
[address] [place] [address] Phone: [phone] Fax: [phone]  Patient ID: Benjamin Proctor MRN: [id number], DOB: 07-Aug-1980, 33 y.o. Date of Encounter: 10/10/2013  Primary Physician:  Hannah BeatSpencer Rodriquez Thorner, MD   Chief Complaint: Diabetes   Subjective:   History of Present Illness:  Benjamin Proctor is a 33 y.o. very pleasant male patient who presents with the following:  Diabetes Mellitus: Tolerating Medications: yes Compliance with diet: fair Exercise: minimal / intermittent Avg blood sugars at home: not checking Foot problems: none Hypoglycemia: none No nausea, vomitting, blurred vision, polyuria.  Lab Results  Component Value Date   HGBA1C 6.7* 10/07/2013   HGBA1C 6.0 01/14/2013   HGBA1C 6.5 07/21/2011   Lab Results  Component Value Date   MICROALBUR 0.4 10/07/2013   LDLCALC 84 01/14/2013   CREATININE 0.9 10/07/2013    Wt Readings from Last 3 Encounters:  10/10/13 268 lb 12 oz (121.904 kg)  01/14/13 266 lb 8 oz (120.884 kg)  01/25/12 259 lb 8 oz (117.708 kg)    Body mass index is 35.46 kg/(m^2).   HTN: Tolerating all medications without side effects Stable and at goal No CP, no sob. No HA.  BP Readings from Last 3 Encounters:  10/10/13 144/90  01/14/13 118/74  01/25/12 122/80   BP 125/80 when i checked it.  Basic Metabolic Panel:    Component Value Date/Time   NA 137 10/07/2013 0838   K 4.5 10/07/2013 0838   CL 104 10/07/2013 0838   CO2 28 10/07/2013 0838   BUN 11 10/07/2013 0838   CREATININE 0.9 10/07/2013 0838   GLUCOSE 116* 10/07/2013 0838   CALCIUM 9.2 10/07/2013 0838   Lipids: Doing well, stable. Tolerating meds fine with no SE. Panel reviewed with patient.  Lipids:    Component Value Date/Time   CHOL 139 01/14/2013 0955   TRIG 106.0 01/14/2013 0955   HDL 34.30* 01/14/2013 0955   LDLDIRECT 105.3 07/21/2011 1641   VLDL 21.2 01/14/2013 0955   CHOLHDL 4 01/14/2013 0955    Lab Results  Component Value Date   ALT 34 01/14/2013   AST 17 01/14/2013     ALKPHOS 43 01/14/2013   BILITOT 0.8 01/14/2013     Past Medical History, Surgical History, Social History, Family History, Problem List, Medications, and Allergies have been reviewed and updated if relevant.  Review of Systems:  GEN: No acute illnesses, no fevers, chills. GI: No n/v/d, eating normally Pulm: No SOB Interactive and getting along well at home.  Otherwise, ROS is as per the HPI.  Objective:   Physical Examination: BP 144/90  Pulse 85  Temp(Src) 98.2 F (36.8 C) (Oral)  Ht 6\' 1"  (1.854 m)  Wt 268 lb 12 oz (121.904 kg)  BMI 35.46 kg/m2   GEN: WDWN, NAD, Non-toxic, A & O x 3 HEENT: Atraumatic, Normocephalic. Neck supple. No masses, No LAD. Ears and Nose: No external deformity. CV: RRR, No M/G/R. No JVD. No thrill. No extra heart sounds. PULM: CTA B, no wheezes, crackles, rhonchi. No retractions. No resp. distress. No accessory muscle use. EXTR: No c/c/e NEURO Normal gait.  PSYCH: Normally interactive. Conversant. Not depressed or anxious appearing.  Calm demeanor.   Laboratory and Imaging Data: Results for orders placed in visit on 10/07/13  HEMOGLOBIN A1C      Result Value Ref Range   Hemoglobin A1C 6.7 (*) 4.6 - 6.5 %  BASIC METABOLIC PANEL      Result Value Ref Range   Sodium 137  135 -  145 mEq/L   Potassium 4.5  3.5 - 5.1 mEq/L   Chloride 104  96 - 112 mEq/L   CO2 28  19 - 32 mEq/L   Glucose, Bld 116 (*) 70 - 99 mg/dL   BUN 11  6 - 23 mg/dL   Creatinine, Ser 0.9  0.4 - 1.5 mg/dL   Calcium 9.2  8.4 - 16.110.5 mg/dL   GFR 096.04108.98  >54.09>60.00 mL/min  MICROALBUMIN / CREATININE URINE RATIO      Result Value Ref Range   Microalb, Ur 0.4  0.0 - 1.9 mg/dL   Creatinine,U 811.9137.8     Microalb Creat Ratio 0.3  0.0 - 30.0 mg/g     Assessment & Plan:   DIABETES MELLITUS, TYPE II  Need for prophylactic vaccination with combined diphtheria-tetanus-pertussis (DTP) vaccine - Plan: Tdap vaccine greater than or equal to 7yo IM  Need for prophylactic vaccination  against Streptococcus pneumoniae (pneumococcus) - Plan: Pneumococcal conjugate vaccine 13-valent  HYPERLIPIDEMIA  Hypertension  DM fairly stable.  No exercise.  Working 80 hours a week.  Tolerating all meds.   Wife pregnant - update vaccines.  Follow-up: Return in about 6 months (around 04/12/2014). Unless noted above, the patient is to follow-up if symptoms worsen. Red flags were reviewed with the patient.  New Prescriptions   No medications on file   Orders Placed This Encounter  Procedures  . Tdap vaccine greater than or equal to 7yo IM  . Pneumococcal conjugate vaccine 13-valent    Signed,  Jere Bostrom T. Salayah Meares, MD, CAQ Sports Medicine   Patient's Medications  New Prescriptions   No medications on file  Previous Medications   FISH OIL-OMEGA-3 FATTY ACIDS 1000 MG CAPSULE    Take 1 g by mouth daily.     GLIPIZIDE XL 5 MG 24 HR TABLET    TAKE 1 TABLET (5 MG TOTAL) BY MOUTH DAILY.   GLUCOSE BLOOD TEST STRIP    1 each by Other route 2 (two) times daily. Use as instructed    LANCETS MISC    Check blood sugar 2 times daily    LOSARTAN (COZAAR) 50 MG TABLET    TAKE 1 TABLET (50 MG TOTAL) BY MOUTH DAILY.   METFORMIN (GLUCOPHAGE-XR) 500 MG 24 HR TABLET    TAKE 4 TABLETS (2,000 MG TOTAL) BY MOUTH DAILY.   SIMVASTATIN (ZOCOR) 40 MG TABLET    TAKE 1 TABLET (40 MG TOTAL) BY MOUTH AT BEDTIME.  Modified Medications   No medications on file  Discontinued Medications   No medications on file

## 2013-10-11 ENCOUNTER — Telehealth: Payer: Self-pay | Admitting: Family Medicine

## 2013-10-11 NOTE — Telephone Encounter (Signed)
Relevant patient education assigned to patient using Emmi. ° °

## 2013-10-25 ENCOUNTER — Other Ambulatory Visit: Payer: Self-pay | Admitting: Family Medicine

## 2013-12-08 ENCOUNTER — Other Ambulatory Visit: Payer: Self-pay | Admitting: Family Medicine

## 2013-12-09 ENCOUNTER — Telehealth: Payer: Self-pay | Admitting: Family Medicine

## 2013-12-09 NOTE — Telephone Encounter (Signed)
Diabetic Bundle.  Pt needs follow up appt to check BP.  Left vm for pt to return call. 

## 2014-01-14 ENCOUNTER — Other Ambulatory Visit: Payer: Self-pay | Admitting: Family Medicine

## 2014-03-24 ENCOUNTER — Other Ambulatory Visit: Payer: Self-pay | Admitting: Family Medicine

## 2014-03-25 NOTE — Telephone Encounter (Signed)
Ok to refill, 3 month supply with f/u for CPX and DM check. (30 min CPX)

## 2014-03-25 NOTE — Telephone Encounter (Signed)
Pt requesting medication refill. Last f/u appt and labs 09/2013-abnormal. pls advise

## 2014-03-26 ENCOUNTER — Telehealth: Payer: Self-pay | Admitting: Family Medicine

## 2014-03-26 NOTE — Telephone Encounter (Signed)
Left message asking pt to call office  Please schedule CPE with fasting labs prior sometime in the next 3 months with Dr. Patsy Lageropland.   Thanks  Lupita LeashDonna

## 2014-04-01 NOTE — Telephone Encounter (Signed)
Left message asking pt to call office  °

## 2014-04-01 NOTE — Telephone Encounter (Signed)
Mrs Mabe left v/m requesting cb [phone]; pt is working.

## 2014-04-02 ENCOUNTER — Encounter: Payer: Self-pay | Admitting: Family Medicine

## 2014-04-02 NOTE — Telephone Encounter (Signed)
Mailed letter °

## 2014-04-17 ENCOUNTER — Telehealth: Payer: Self-pay | Admitting: Family Medicine

## 2014-04-17 ENCOUNTER — Other Ambulatory Visit: Payer: Self-pay | Admitting: Family Medicine

## 2014-04-17 NOTE — Telephone Encounter (Signed)
Left message asking pt to call office  Please schedule below appointment        Will you please schedule Mr. Benjamin Proctor an appointment with Dr. Patsy Lageropland to follow up on his diabetes.   Thanks,  Benjamin Proctor

## 2014-04-22 NOTE — Telephone Encounter (Signed)
Left message asking pt to call office  °

## 2014-04-28 NOTE — Telephone Encounter (Signed)
Left message asking pt to call office  °

## 2014-04-29 ENCOUNTER — Encounter: Payer: Self-pay | Admitting: Family Medicine

## 2014-04-29 NOTE — Telephone Encounter (Signed)
Mailed letter °

## 2014-05-26 ENCOUNTER — Other Ambulatory Visit: Payer: Self-pay | Admitting: Family Medicine

## 2014-06-28 ENCOUNTER — Other Ambulatory Visit: Payer: Self-pay | Admitting: Family Medicine

## 2014-09-03 ENCOUNTER — Other Ambulatory Visit: Payer: Self-pay | Admitting: Family Medicine

## 2014-09-03 ENCOUNTER — Other Ambulatory Visit: Payer: BC Managed Care – PPO

## 2014-09-03 DIAGNOSIS — Z79899 Other long term (current) drug therapy: Secondary | ICD-10-CM

## 2014-09-03 DIAGNOSIS — E119 Type 2 diabetes mellitus without complications: Secondary | ICD-10-CM

## 2014-09-03 DIAGNOSIS — E785 Hyperlipidemia, unspecified: Secondary | ICD-10-CM

## 2014-09-08 ENCOUNTER — Other Ambulatory Visit (INDEPENDENT_AMBULATORY_CARE_PROVIDER_SITE_OTHER): Payer: BLUE CROSS/BLUE SHIELD

## 2014-09-08 DIAGNOSIS — Z79899 Other long term (current) drug therapy: Secondary | ICD-10-CM

## 2014-09-08 DIAGNOSIS — E785 Hyperlipidemia, unspecified: Secondary | ICD-10-CM

## 2014-09-08 DIAGNOSIS — E119 Type 2 diabetes mellitus without complications: Secondary | ICD-10-CM

## 2014-09-08 LAB — HEPATIC FUNCTION PANEL
ALBUMIN: 3.9 g/dL (ref 3.5–5.2)
ALT: 46 U/L (ref 0–53)
AST: 22 U/L (ref 0–37)
Alkaline Phosphatase: 64 U/L (ref 39–117)
BILIRUBIN TOTAL: 0.7 mg/dL (ref 0.2–1.2)
Bilirubin, Direct: 0.2 mg/dL (ref 0.0–0.3)
Total Protein: 6.7 g/dL (ref 6.0–8.3)

## 2014-09-08 LAB — CBC WITH DIFFERENTIAL/PLATELET
BASOS ABS: 0 10*3/uL (ref 0.0–0.1)
Basophils Relative: 0.4 % (ref 0.0–3.0)
EOS PCT: 1.5 % (ref 0.0–5.0)
Eosinophils Absolute: 0.1 10*3/uL (ref 0.0–0.7)
HEMATOCRIT: 43.2 % (ref 39.0–52.0)
Hemoglobin: 14.8 g/dL (ref 13.0–17.0)
Lymphocytes Relative: 46.5 % — ABNORMAL HIGH (ref 12.0–46.0)
Lymphs Abs: 2.5 10*3/uL (ref 0.7–4.0)
MCHC: 34.3 g/dL (ref 30.0–36.0)
MCV: 83.7 fl (ref 78.0–100.0)
MONO ABS: 0.7 10*3/uL (ref 0.1–1.0)
Monocytes Relative: 12.1 % — ABNORMAL HIGH (ref 3.0–12.0)
Neutro Abs: 2.2 10*3/uL (ref 1.4–7.7)
Neutrophils Relative %: 39.5 % — ABNORMAL LOW (ref 43.0–77.0)
Platelets: 253 10*3/uL (ref 150.0–400.0)
RBC: 5.16 Mil/uL (ref 4.22–5.81)
RDW: 13.1 % (ref 11.5–15.5)
WBC: 5.5 10*3/uL (ref 4.0–10.5)

## 2014-09-08 LAB — HEMOGLOBIN A1C: HEMOGLOBIN A1C: 10 % — AB (ref 4.6–6.5)

## 2014-09-08 LAB — BASIC METABOLIC PANEL
BUN: 10 mg/dL (ref 6–23)
CHLORIDE: 103 meq/L (ref 96–112)
CO2: 29 meq/L (ref 19–32)
CREATININE: 0.86 mg/dL (ref 0.40–1.50)
Calcium: 9.2 mg/dL (ref 8.4–10.5)
GFR: 108.37 mL/min (ref >60.00)
Glucose, Bld: 226 mg/dL — ABNORMAL HIGH (ref 70–99)
POTASSIUM: 4.2 meq/L (ref 3.5–5.1)
Sodium: 138 mEq/L (ref 135–145)

## 2014-09-08 LAB — LIPID PANEL
CHOLESTEROL: 182 mg/dL (ref 0–200)
HDL: 37.2 mg/dL — ABNORMAL LOW (ref >39.00)
LDL Cholesterol: 108 mg/dL — ABNORMAL HIGH (ref 0–99)
NonHDL: 144.8
TRIGLYCERIDES: 185 mg/dL — AB (ref 0.0–149.0)
Total CHOL/HDL Ratio: 5
VLDL: 37 mg/dL (ref 0.0–40.0)

## 2014-09-08 LAB — MICROALBUMIN / CREATININE URINE RATIO
Creatinine,U: 171.7 mg/dL
Microalb Creat Ratio: 0.4 mg/g (ref 0.0–30.0)
Microalb, Ur: 0.7 mg/dL (ref 0.0–1.9)

## 2014-09-10 ENCOUNTER — Ambulatory Visit (INDEPENDENT_AMBULATORY_CARE_PROVIDER_SITE_OTHER): Payer: BLUE CROSS/BLUE SHIELD | Admitting: Family Medicine

## 2014-09-10 ENCOUNTER — Encounter: Payer: Self-pay | Admitting: Family Medicine

## 2014-09-10 VITALS — BP 120/84 | HR 96 | Temp 98.7°F | Ht 71.0 in | Wt 270.5 lb

## 2014-09-10 DIAGNOSIS — E1165 Type 2 diabetes mellitus with hyperglycemia: Secondary | ICD-10-CM

## 2014-09-10 DIAGNOSIS — I10 Essential (primary) hypertension: Secondary | ICD-10-CM

## 2014-09-10 DIAGNOSIS — Z9119 Patient's noncompliance with other medical treatment and regimen: Secondary | ICD-10-CM | POA: Insufficient documentation

## 2014-09-10 DIAGNOSIS — E785 Hyperlipidemia, unspecified: Secondary | ICD-10-CM

## 2014-09-10 DIAGNOSIS — IMO0001 Reserved for inherently not codable concepts without codable children: Secondary | ICD-10-CM

## 2014-09-10 DIAGNOSIS — Z91199 Patient's noncompliance with other medical treatment and regimen due to unspecified reason: Secondary | ICD-10-CM | POA: Insufficient documentation

## 2014-09-10 DIAGNOSIS — Z0000 Encounter for general adult medical examination without abnormal findings: Secondary | ICD-10-CM

## 2014-09-10 MED ORDER — GLIPIZIDE ER 10 MG PO TB24: ORAL_TABLET | ORAL | Status: DC

## 2014-09-10 NOTE — Progress Notes (Signed)
Dr. Frederico Hamman T. Wilburt Messina, MD, [facility] [place], [address] Phone: [phone] Fax: [phone]  09/10/2014  Patient: Benjamin Proctor, MRN: [id number], DOB: 1980-06-09, 34 y.o.  Primary Physician:  Owens Loffler, MD  Chief Complaint: Annual Exam  Subjective:   Benjamin Proctor is a 34 y.o. pleasant patient who presents with the following:  Preventative Health Maintenance Visit and f/u ongoing medical issues:  Health Maintenance Summary Reviewed and updated, unless pt declines services.  Tobacco History Reviewed. Alcohol: No concerns, no excessive use Exercise Habits: none, rec at least 30 mins 5 times a week STD concerns: no risk or activity to increase risk Drug Use: None Encouraged self-testicular check  Health Maintenance  Topic Date Due  . HIV Screening  12/18/1995  . INFLUENZA VACCINE  12/29/2014  . HEMOGLOBIN A1C  03/10/2015  . PNEUMOCOCCAL POLYSACCHARIDE VACCINE (2) 03/30/2015  . URINE MICROALBUMIN  09/08/2015  . FOOT EXAM  09/10/2015  . OPHTHALMOLOGY EXAM  09/10/2015  . TETANUS/TDAP  10/11/2023   Immunization History  Administered Date(s) Administered  . Pneumococcal Conjugate-13 10/10/2013  . Pneumococcal Polysaccharide-23 03/29/2010  . Tdap 10/10/2013   Diabetes Mellitus: Tolerating Medications: yes Compliance with diet: fair / poor Exercise: minimal  Avg blood sugars at home: not checking Foot problems: none Hypoglycemia: none No nausea, vomitting, blurred vision, polyuria. Forgets meds 2 days a week on avg  Lab Results  Component Value Date   HGBA1C 10.0* 09/08/2014   HGBA1C 6.7* 10/07/2013   HGBA1C 6.0 01/14/2013   Lab Results  Component Value Date   MICROALBUR 0.7 09/08/2014   LDLCALC 108* 09/08/2014   CREATININE 0.86 09/08/2014    Wt Readings from Last 3 Encounters:  09/10/14 270 lb 8 oz (122.698 kg)  10/10/13 268 lb 12 oz (121.904 kg)  01/14/13 266 lb 8 oz (120.884 kg)     Body mass index is 37.74 kg/(m^2).   Lipids: Doing well, stable. Tolerating meds fine with no SE. Panel reviewed with patient.  Lipids:    Component Value Date/Time   CHOL 182 09/08/2014 0824   TRIG 185.0* 09/08/2014 0824   HDL 37.20* 09/08/2014 0824   LDLDIRECT 105.3 07/21/2011 1641   VLDL 37.0 09/08/2014 0824   CHOLHDL 5 09/08/2014 0824    Lab Results  Component Value Date   ALT 46 09/08/2014   AST 22 09/08/2014   ALKPHOS 64 09/08/2014   BILITOT 0.7 09/08/2014    HTN: Tolerating all medications without side effects Stable and at goal No CP, no sob. No HA.  BP Readings from Last 3 Encounters:  09/10/14 120/84  10/10/13 144/90  01/14/13 527/78    Basic Metabolic Panel:    Component Value Date/Time   NA 138 09/08/2014 0824   K 4.2 09/08/2014 0824   CL 103 09/08/2014 0824   CO2 29 09/08/2014 0824   BUN 10 09/08/2014 0824   CREATININE 0.86 09/08/2014 0824   GLUCOSE 226* 09/08/2014 0824   CALCIUM 9.2 09/08/2014 0824      Patient Active Problem List   Diagnosis Date Noted  . Hypertension 10/01/2010    Priority: High  . Hyperlipidemia LDL goal <70 01/15/2009    Priority: High  . Diabetes mellitus type 2, uncontrolled, without complications 24/23/5361    Priority: High  . History of noncompliance with medical treatment 09/10/2014  . Neurocysticercosis   . OBESITY 12/16/2008  . 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 GERD 12/16/2008   Past Medical History  Diagnosis Date  . GERD (gastroesophageal reflux disease)   . 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 Diabetes mellitus   . Hyperlipidemia   . Hypogonadism male   . Obesity   . Hypertension 10/01/2010  . 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 Neurocysticercosis removal  1998    Cranial   History   Social History  . Marital Status: Married    Spouse Name: N/A  . Number of Children:  N/A  . Years of Education: N/A   Occupational History  . Not on file.   Social History Main Topics  . Smoking status: Current Some Day Smoker    Types: Cigarettes  . Smokeless tobacco: Never Used  . Alcohol Use: No  . Drug Use: No  . Sexual Activity: Not on file   Other Topics Concern  . Not on file   Social History Narrative   Family History  Problem Relation Age of Onset  . Diabetes type II Father    Allergies  Allergen Reactions  . Ace Inhibitors Cough  . Carbamazepine   . Penicillins   . Phenytoin     Medication list has been reviewed and updated.   General: Denies fever, chills, sweats. No significant weight loss. Eyes: Denies blurring,significant itching ENT: Denies earache, sore throat, and hoarseness. Cardiovascular: Denies chest pains, palpitations, dyspnea on exertion Respiratory: Denies cough, dyspnea at rest,wheeezing Breast: no concerns about lumps GI: Denies nausea, vomiting, diarrhea, constipation, change in bowel habits, abdominal pain, melena, hematochezia GU: Denies penile discharge, ED, urinary flow / outflow problems. No STD concerns. Musculoskeletal: Denies back pain, joint pain Derm: Denies rash, itching Neuro: Denies  paresthesias, frequent falls, frequent headaches Psych: Denies depression, anxiety Endocrine: Denies cold intolerance, heat intolerance, polydipsia Heme: Denies enlarged lymph nodes Allergy: No hayfever  Objective:   BP 120/84 mmHg  Pulse 96  Temp(Src) 98.7 F (37.1 C) (Oral)  Ht 5' 11"  (1.803 m)  Wt 270 lb 8 oz (122.698 kg)  BMI 37.74 kg/m2 Ideal Body Weight: Weight in (lb) to have BMI = 25: 178.9  No exam data present  GEN: well developed, well nourished, no acute distress Eyes: conjunctiva and lids normal, PERRLA, EOMI ENT: TM clear, nares clear, oral exam WNL Neck: supple, no lymphadenopathy, no thyromegaly, no JVD Pulm: clear to auscultation and percussion, respiratory effort normal CV: regular rate and  rhythm, S1-S2, no murmur, rub or gallop, no bruits, peripheral pulses normal and symmetric, no cyanosis, clubbing, edema or varicosities GI: soft, non-tender; no hepatosplenomegaly, masses; active bowel sounds all quadrants GU: no hernia, testicular mass, penile discharge Lymph: no cervical, axillary or inguinal adenopathy MSK: gait normal, muscle tone and strength WNL, no joint swelling, effusions, discoloration, crepitus  SKIN: clear, good turgor, color WNL, no rashes, lesions, or ulcerations Neuro: normal mental status, normal strength, sensation, and motion Psych: alert; oriented to person, place and time, normally interactive and not anxious or depressed in appearance. All labs reviewed with patient.  Lipids:    Component Value Date/Time   CHOL 182 09/08/2014 0824   TRIG 185.0* 09/08/2014 0824   HDL 37.20* 09/08/2014 0824   LDLDIRECT 105.3 07/21/2011 1641   VLDL 37.0 09/08/2014 0824   CHOLHDL 5 09/08/2014 0824   CBC: CBC Latest Ref Rng 09/08/2014 01/14/2013 03/29/2010  WBC 4.0 - 10.5 K/uL 5.5 4.9 6.2  Hemoglobin 13.0 - 17.0 g/dL 14.8  15.2 14.8  Hematocrit 39.0 - 52.0 % 43.2 43.9 41.7  Platelets 150.0 - 400.0 K/uL 253.0 232.0 379.0    Basic Metabolic Panel:    Component Value Date/Time   NA 138 09/08/2014 0824   K 4.2 09/08/2014 0824   CL 103 09/08/2014 0824   CO2 29 09/08/2014 0824   BUN 10 09/08/2014 0824   CREATININE 0.86 09/08/2014 0824   GLUCOSE 226* 09/08/2014 0824   CALCIUM 9.2 09/08/2014 0824   Hepatic Function Latest Ref Rng 09/08/2014 01/14/2013 03/29/2010  Total Protein 6.0 - 8.3 g/dL 6.7 6.9 6.8  Albumin 3.5 - 5.2 g/dL 3.9 3.9 3.9  AST 0 - 37 U/L 22 17 34  ALT 0 - 53 U/L 46 34 55(H)  Alk Phosphatase 39 - 117 U/L 64 43 62  Total Bilirubin 0.2 - 1.2 mg/dL 0.7 0.8 1.0  Bilirubin, Direct 0.0 - 0.3 mg/dL 0.2 0.0 0.2    No results found for: TSH No results found for: PSA  Assessment and Plan:   Routine general medical examination at a health care  facility  Diabetes mellitus type 2, uncontrolled, without complications: >24 minutes spent in face to face time with patient, >50% spent in counselling or coordination of care: DM doing poorly. Work on diet, compliance, and increase glipizide. Early recheck.  Hyperlipidemia LDL goal <70  Essential hypertension  History of noncompliance with medical treatment  Health Maintenance Exam: The patient's preventative maintenance and recommended screening tests for an annual wellness exam were reviewed in full today. Brought up to date unless services declined.  Counselled on the importance of diet, exercise, and its role in overall health and mortality. The patient's FH and SH was reviewed, including their home life, tobacco status, and drug and alcohol status.  Follow-up: Return in about 4 months (around 01/10/2015). Unless noted, follow-up in 1 year for Health Maintenance Exam.  New Prescriptions   No medications on file   No orders of the defined types were placed in this encounter.    Signed,  Maud Deed. Letishia Elliott, MD   Patient's Medications  New Prescriptions   No medications on file  Previous Medications   FISH OIL-OMEGA-3 FATTY ACIDS 1000 MG CAPSULE    Take 1 g by mouth daily.     GLUCOSE BLOOD TEST STRIP    1 each by Other route 2 (two) times daily. Use as instructed    LANCETS MISC    Check blood sugar 2 times daily    LOSARTAN (COZAAR) 50 MG TABLET    TAKE 1 TABLET (50 MG TOTAL) BY MOUTH DAILY.   METFORMIN (GLUCOPHAGE-XR) 500 MG 24 HR TABLET    TAKE 4 TABLETS (2,000 MG TOTAL) BY MOUTH DAILY.   SIMVASTATIN (ZOCOR) 40 MG TABLET    TAKE 1 TABLET (40 MG TOTAL) BY MOUTH AT BEDTIME.  Modified Medications   Modified Medication Previous Medication   GLIPIZIDE (GLUCOTROL XL) 10 MG 24 HR TABLET GLIPIZIDE XL 5 MG 24 hr tablet      TAKE 1 TABLET (10 MG TOTAL) BY MOUTH DAILY.    TAKE 1 TABLET (5 MG TOTAL) BY MOUTH DAILY.  Discontinued Medications   LOSARTAN (COZAAR) 50 MG TABLET     TAKE 1 TABLET (50 MG TOTAL) BY MOUTH DAILY.

## 2014-09-10 NOTE — Progress Notes (Signed)
Pre visit review using our clinic review tool, if applicable. No additional management support is needed unless otherwise documented below in the visit note. 

## 2014-09-16 NOTE — Op Note (Signed)
PATIENT NAME:  Benjamin KidBRAVO, Benjamin Proctor MR#:  [id number] DATE OF BIRTH:  [birth year]  DATE OF PROCEDURE:  02/07/2012  PREOPERATIVE DIAGNOSIS: Phimosis.   POSTOPERATIVE DIAGNOSIS: Phimosis.   PROCEDURE: Circumcision.   SURGEON: Madolyn FriezeBrian S. Achilles Dunkope, MD   ANESTHESIA: Laryngeal mask airway anesthesia.   INDICATIONS: The patient is a 34 year old gentleman with a history of diabetes mellitus. He has had progressive difficulty withdrawing the foreskin. He was recently in the Emergency Room due to inability to void. He is retaining moderate bladder volumes due to the severity of the phimosis. He had significant bleeding of the foreskin with any attempts at voiding. He presents for circumcision.   PROCEDURE: After informed consent was obtained, the patient was taken to the operating room and placed in the supine position on the operating table under laryngeal mask airway anesthesia. The patient was then prepped and draped in the usual standard fashion. An attempt was made at withdrawing the foreskin which was unsuccessful due to the degree of phimosis. A hemostat was placed in the opening. It could not be completely inserted due to the degree of phimosis. A small skin incision was made anterior developing a passage that allowed a dorsal slit to be performed. Once approximately 1.5 cm of tissue was incised, the foreskin was able to be withdrawn. The glans penis was noted to be very white with numerous erythematous skin lesions. This was also noted throughout the entire foreskin. The glans could not be well separated from the foreskin. A circumferential incision was made approximately 2 mm proximal to the partial coronal sulcus. The skin was noted to be very thickened and abnormal. The skin was removed circumferentially utilizing electrocautery. Additional deeper layers of subcutaneous tissue were also removed due to the degree of inflammation. The remaining skin was withdrawn over the glans penis. A hemostat was placed at the 6  and 12 o'clock positions just behind the level of normal tissue. A circumferential incision was made at this level. The foreskin cuff that was remaining was removed utilizing electrocautery. Once again some additional subcutaneous tissue was removed due to the degree of thickening. Several areas of bleeding were identified and cauterized utilizing electrocautery. Once adequate hemostasis was obtained, the skin edges were reapproximated, first at the 6 and 12 o'clock positions utilizing interrupted 3-0 Monocryl suture. Additional interrupted 3-0 Monocryl suture were then utilized to complete the reanastomosis circumferentially. A Vaseline gauze, Kling, and Coban dressing was then applied. The patient had a penile block performed with 10 mL of 0.5% Marcaine. The patient was then awakened from laryngeal mask airway anesthesia. He was taken to the recovery room in stable condition. There were no problems or complications. The patient tolerated the procedure well.   ____________________________ Madolyn FriezeBrian S. Achilles Dunkope, MD bsc:drc D: 02/08/2012 13:10:58 ET T: 02/08/2012 13:24:30 ET JOB#: [id number]  cc: Madolyn FriezeBrian S. Achilles Dunkope, MD, <Dictator> Madolyn FriezeBRIAN S Naryiah Schley MD ELECTRONICALLY SIGNED 02/09/2012 8:08

## 2014-11-05 ENCOUNTER — Other Ambulatory Visit: Payer: Self-pay | Admitting: Family Medicine

## 2015-01-06 ENCOUNTER — Other Ambulatory Visit: Payer: Self-pay | Admitting: Family Medicine

## 2015-01-12 ENCOUNTER — Ambulatory Visit: Payer: BLUE CROSS/BLUE SHIELD | Admitting: Family Medicine

## 2015-02-04 ENCOUNTER — Ambulatory Visit (INDEPENDENT_AMBULATORY_CARE_PROVIDER_SITE_OTHER): Payer: BLUE CROSS/BLUE SHIELD | Admitting: Family Medicine

## 2015-02-04 ENCOUNTER — Encounter: Payer: Self-pay | Admitting: Family Medicine

## 2015-02-04 VITALS — BP 124/80 | HR 90 | Temp 98.2°F | Ht 71.0 in | Wt 261.2 lb

## 2015-02-04 DIAGNOSIS — E785 Hyperlipidemia, unspecified: Secondary | ICD-10-CM

## 2015-02-04 DIAGNOSIS — E1165 Type 2 diabetes mellitus with hyperglycemia: Secondary | ICD-10-CM

## 2015-02-04 DIAGNOSIS — I10 Essential (primary) hypertension: Secondary | ICD-10-CM | POA: Diagnosis not present

## 2015-02-04 DIAGNOSIS — IMO0001 Reserved for inherently not codable concepts without codable children: Secondary | ICD-10-CM

## 2015-02-04 DIAGNOSIS — E669 Obesity, unspecified: Secondary | ICD-10-CM | POA: Diagnosis not present

## 2015-02-04 MED ORDER — GLIPIZIDE 5 MG PO TABS: 5.0000 mg | ORAL_TABLET | Freq: Two times a day (BID) | ORAL | Status: DC

## 2015-02-04 MED ORDER — GLIPIZIDE 10 MG PO TABS
10.0000 mg | ORAL_TABLET | Freq: Two times a day (BID) | ORAL | Status: DC
Start: 2015-02-04 — End: 2016-02-15

## 2015-02-04 NOTE — Progress Notes (Signed)
Pre visit review using our clinic review tool, if applicable. No additional management support is needed unless otherwise documented below in the visit note. 

## 2015-02-04 NOTE — Progress Notes (Signed)
Dr. Karleen Hampshire T. Shaquetta Arcos, MD, [facility] [address] [place], [address] Phone: [phone] Fax: [phone]  02/04/2015  Patient: Benjamin Proctor, MRN: [id number], DOB: 10-03-1980, 34 y.o.  Primary Physician:  [name], MD  Chief Complaint: Diabetes  Subjective:   Benjamin Proctor is a 34 y.o. very pleasant male patient who presents with the following:  Diabetes Mellitus: Tolerating Medications: yes, but stopped his glipizide xl due to cost.  Compliance with diet: fair Exercise: minimal / intermittent Avg blood sugars at home: not checking Foot problems: none Hypoglycemia: none No nausea, vomitting, blurred vision, polyuria.  Lab Results  Component Value Date   HGBA1C 10.0* 09/08/2014   HGBA1C 6.7* 10/07/2013   HGBA1C 6.0 01/14/2013   Lab Results  Component Value Date   MICROALBUR 0.7 09/08/2014   LDLCALC 108* 09/08/2014   CREATININE 0.86 09/08/2014    Wt Readings from Last 3 Encounters:  02/04/15 261 lb 4 oz (118.502 kg)  09/10/14 270 lb 8 oz (122.698 kg)  10/10/13 268 lb 12 oz (121.904 kg)    Body mass index is 36.45 kg/(m^2).   HTN: Tolerating all medications without side effects Stable and at goal No CP, no sob. No HA.  BP Readings from Last 3 Encounters:  02/04/15 124/80  09/10/14 120/84  10/10/13 144/90    Basic Metabolic Panel:    Component Value Date/Time   NA 138 09/08/2014 0824   NA 140 01/27/2012 0206   K 4.2 09/08/2014 0824   K 3.7 01/27/2012 0206   CL 103 09/08/2014 0824   CL 105 01/27/2012 0206   CO2 29 09/08/2014 0824   CO2 28 01/27/2012 0206   BUN 10 09/08/2014 0824   BUN 19* 01/27/2012 0206   CREATININE 0.86 09/08/2014 0824   CREATININE 1.10 01/27/2012 0206   GLUCOSE 226* 09/08/2014 0824   GLUCOSE 113* 01/27/2012 0206   CALCIUM 9.2 09/08/2014 0824   CALCIUM 9.1 01/27/2012 0206     Lipids: Doing well, stable. Tolerating meds fine with no SE. Panel reviewed with  patient.  Lipids:    Component Value Date/Time   CHOL 182 09/08/2014 0824   TRIG 185.0* 09/08/2014 0824   HDL 37.20* 09/08/2014 0824   LDLDIRECT 105.3 07/21/2011 1641   VLDL 37.0 09/08/2014 0824   CHOLHDL 5 09/08/2014 0824    Lab Results  Component Value Date   ALT 46 09/08/2014   AST 22 09/08/2014   ALKPHOS 64 09/08/2014   BILITOT 0.7 09/08/2014     Past Medical History, Surgical History, Social History, Family History, Problem List, Medications, and Allergies have been reviewed and updated if relevant.  Patient Active Problem List   Diagnosis Date Noted  . Hypertension 10/01/2010    Priority: High  . Hyperlipidemia LDL goal <70 01/15/2009    Priority: High  . Diabetes mellitus type 2, uncontrolled, without complications 12/16/2008    Priority: High  . History of noncompliance with medical treatment 09/10/2014  . Neurocysticercosis   . OBESITY 12/16/2008  . 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 GERD 12/16/2008    Past Medical History  Diagnosis Date  . GERD (gastroesophageal reflux disease)   . 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 Diabetes mellitus   . Hyperlipidemia   . Hypogonadism male   . Obesity   . Hypertension 10/01/2010  . 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 Marital Status: Married    Spouse Name: N/A  . Number of Children: N/A  . Years of Education: N/A   Occupational History  . Not on file.   Social History Main Topics  . Smoking status: Current Some Day Smoker    Types: Cigarettes  . Smokeless tobacco: Never Used  . Alcohol Use: No  . Drug Use: No  . Sexual Activity: Not on file   Other Topics Concern  . Not on file   Social History Narrative    Family History  Problem Relation Age of Onset  . Diabetes type II Father      Allergies  Allergen Reactions  . Ace Inhibitors Cough  . Carbamazepine   . Penicillins   . Phenytoin     Medication list reviewed and updated in full in [facility].   GEN: No acute illnesses, no fevers, chills. GI: No n/v/d, eating normally Pulm: No SOB Interactive and getting along well at home.  Otherwise, ROS is as per the HPI.  Objective:   BP 124/80 mmHg  Pulse 90  Temp(Src) 98.2 F (36.8 C) (Oral)  Ht  (1.803 m)  Wt 261 lb 4 oz (118.502 kg)  BMI 36.45 kg/m2  GEN: WDWN, NAD, Non-toxic, A & O x 3 HEENT: Atraumatic, Normocephalic. Neck supple. No masses, No LAD. Ears and Nose: No external deformity. CV: RRR, No M/G/R. No JVD. No thrill. No extra heart sounds. PULM: CTA B, no wheezes, crackles, rhonchi. No retractions. No resp. distress. No accessory muscle use. EXTR: No c/c/e NEURO Normal gait.  PSYCH: Normally interactive. Conversant. Not depressed or anxious appearing.  Calm demeanor.   Laboratory and Imaging Data:  Assessment and Plan:   Diabetes mellitus type 2, uncontrolled, without complications - Plan: Hemoglobin A1c  Essential hypertension  Hyperlipidemia LDL goal <70  Obesity  Start glizide 5 mg bid for 1 month, then increase to 10 mg bid  HTN and lipids are stable  Follow-up: Return in about 3 months (around 05/06/2015).  New Prescriptions   GLIPIZIDE (GLUCOTROL) 10 MG TABLET    Take 1 tablet (10 mg total) by mouth 2 (two) times daily before a meal.   GLIPIZIDE (GLUCOTROL) 5 MG TABLET    Take 1 tablet (5 mg total) by mouth 2 (two) times daily before a meal.   Orders Placed This Encounter  Procedures  . Hemoglobin A1c    Signed,  Adraine Biffle T. Chantay Whitelock, MD   Patient's Medications  New Prescriptions   GLIPIZIDE (GLUCOTROL) 10 MG TABLET    Take 1 tablet (10 mg total) by mouth 2 (two) times daily before a meal.   GLIPIZIDE (GLUCOTROL) 5 MG TABLET    Take 1 tablet (5 mg total) by mouth 2 (two) times daily before a meal.   Previous Medications   FISH OIL-OMEGA-3 FATTY ACIDS 1000 MG CAPSULE    Take 1 g by mouth daily.     GLUCOSE BLOOD TEST STRIP    1 each by Other route 2 (two) times daily. Use as instructed    LANCETS MISC    Check blood sugar 2 times daily    LOSARTAN (COZAAR) 50 MG TABLET    TAKE 1 TABLET (50 MG TOTAL) BY MOUTH DAILY.   METFORMIN (GLUCOPHAGE-XR) 500 MG 24 HR TABLET    TAKE 4 TABLETS (2,000 MG  TOTAL) BY MOUTH DAILY.   SIMVASTATIN (ZOCOR) 40 MG TABLET    TAKE 1 TABLET (40 MG TOTAL) BY MOUTH AT BEDTIME.  Modified Medications   No medications on file  Discontinued Medications   GLIPIZIDE (GLUCOTROL XL) 10 MG 24 HR TABLET    TAKE 1 TABLET (10 MG TOTAL) BY MOUTH DAILY.   LOSARTAN (COZAAR) 50 MG TABLET    TAKE 1 TABLET (50 MG TOTAL) BY MOUTH DAILY.

## 2015-02-05 ENCOUNTER — Telehealth: Payer: Self-pay

## 2015-02-05 LAB — HEMOGLOBIN A1C: Hgb A1c MFr Bld: 11.4 % — ABNORMAL HIGH (ref 4.6–6.5)

## 2015-02-05 NOTE — Telephone Encounter (Signed)
Ms Grafton(No DPR signed) left v/m wanting to know which glipizide pt should take 5 mg or 10 mg. Per 02/04/15 office note pt is to take glipizide 5 mg bid for 1 month and then increase to 10 mg bid. Left v/m for pt to call back.

## 2015-02-06 ENCOUNTER — Encounter: Payer: Self-pay | Admitting: *Deleted

## 2015-04-01 ENCOUNTER — Other Ambulatory Visit: Payer: Self-pay | Admitting: Family Medicine

## 2015-04-22 NOTE — Telephone Encounter (Signed)
No answer at contact #s and no v/m.

## 2015-04-29 ENCOUNTER — Encounter: Payer: Self-pay | Admitting: Family Medicine

## 2015-04-29 ENCOUNTER — Ambulatory Visit (INDEPENDENT_AMBULATORY_CARE_PROVIDER_SITE_OTHER): Payer: BLUE CROSS/BLUE SHIELD | Admitting: Family Medicine

## 2015-04-29 VITALS — BP 132/86 | HR 84 | Temp 98.0°F | Ht 71.0 in | Wt 272.8 lb

## 2015-04-29 DIAGNOSIS — E785 Hyperlipidemia, unspecified: Secondary | ICD-10-CM | POA: Diagnosis not present

## 2015-04-29 DIAGNOSIS — E1165 Type 2 diabetes mellitus with hyperglycemia: Secondary | ICD-10-CM

## 2015-04-29 DIAGNOSIS — IMO0001 Reserved for inherently not codable concepts without codable children: Secondary | ICD-10-CM

## 2015-04-29 DIAGNOSIS — I10 Essential (primary) hypertension: Secondary | ICD-10-CM

## 2015-04-29 LAB — LIPID PANEL
CHOLESTEROL: 173 mg/dL (ref 0–200)
HDL: 37.6 mg/dL — ABNORMAL LOW (ref >39.00)
LDL Cholesterol: 96 mg/dL (ref 0–99)
NONHDL: 135.6
Total CHOL/HDL Ratio: 5
Triglycerides: 197 mg/dL — ABNORMAL HIGH (ref 0.0–149.0)
VLDL: 39.4 mg/dL (ref 0.0–40.0)

## 2015-04-29 LAB — HEMOGLOBIN A1C: HEMOGLOBIN A1C: 9.9 % — AB (ref 4.6–6.5)

## 2015-04-29 NOTE — Progress Notes (Signed)
Pre visit review using our clinic review tool, if applicable. No additional management support is needed unless otherwise documented below in the visit note. 

## 2015-04-29 NOTE — Progress Notes (Signed)
Dr. Karleen HampshireSpencer T. Caliegh Middlekauff, MD, CAQ Sports [facility] [address] [place], [id number] Phone: [phone] Fax: [phone]  04/29/2015  Patient: Benjamin Proctor Robison, MRN: [id number], DOB: Jun 29, 1980, 34 y.o.  Primary Physician:  Hannah BeatSpencer Suzane Vanderweide, MD   Chief Complaint  Patient presents with  . Follow-up    3 month f/u   Subjective:   Benjamin Proctor Fitzsimons is a 34 y.o. very pleasant male patient who presents with the following:  Wt Readings from Last 3 Encounters:  04/29/15 272 lb 12 oz (123.719 kg)  02/04/15 261 lb 4 oz (118.502 kg)  09/10/14 270 lb 8 oz (122.698 kg)    Diabetes Mellitus: Tolerating Medications: yes - BS high Compliance with diet: fair Exercise: minimal / intermittent Avg blood sugars at home: not checking Foot problems: none Hypoglycemia: none No nausea, vomitting, blurred vision, polyuria.  Lab Results  Component Value Date   HGBA1C 11.4* 02/04/2015   HGBA1C 10.0* 09/08/2014   HGBA1C 6.7* 10/07/2013   Lab Results  Component Value Date   MICROALBUR 0.7 09/08/2014   LDLCALC 108* 09/08/2014   CREATININE 0.86 09/08/2014    Wt Readings from Last 3 Encounters:  04/29/15 272 lb 12 oz (123.719 kg)  02/04/15 261 lb 4 oz (118.502 kg)  09/10/14 270 lb 8 oz (122.698 kg)    Body mass index is 38.06 kg/(m^2).   Lipids: Doing well, stable. Tolerating meds fine with no SE. Panel reviewed with patient.  Lipids:    Component Value Date/Time   CHOL 182 09/08/2014 0824   TRIG 185.0* 09/08/2014 0824   HDL 37.20* 09/08/2014 0824   LDLDIRECT 105.3 07/21/2011 1641   VLDL 37.0 09/08/2014 0824   CHOLHDL 5 09/08/2014 0824    Lab Results  Component Value Date   ALT 46 09/08/2014   AST 22 09/08/2014   ALKPHOS 64 09/08/2014   BILITOT 0.7 09/08/2014    HTN: Tolerating all medications without side effects Stable and at goal No CP, no sob. No HA.  BP Readings from Last 3 Encounters:  04/29/15 132/86  02/04/15 124/80  09/10/14  120/84    Basic Metabolic Panel:    Component Value Date/Time   NA 138 09/08/2014 0824   NA 140 01/27/2012 0206   K 4.2 09/08/2014 0824   K 3.7 01/27/2012 0206   CL 103 09/08/2014 0824   CL 105 01/27/2012 0206   CO2 29 09/08/2014 0824   CO2 28 01/27/2012 0206   BUN 10 09/08/2014 0824   BUN 19* 01/27/2012 0206   CREATININE 0.86 09/08/2014 0824   CREATININE 1.10 01/27/2012 0206   GLUCOSE 226* 09/08/2014 0824   GLUCOSE 113* 01/27/2012 0206   CALCIUM 9.2 09/08/2014 0824   CALCIUM 9.1 01/27/2012 0206      Past Medical History, Surgical History, Social History, Family History, Problem List, Medications, and Allergies have been reviewed and updated if relevant.  Patient Active Problem List   Diagnosis Date Noted  . Hypertension 10/01/2010    Priority: High  . Hyperlipidemia LDL goal <70 01/15/2009    Priority: High  . Diabetes mellitus type 2, uncontrolled, without complications (HCC) 12/16/2008    Priority: High  . History of noncompliance with medical treatment 09/10/2014  . Neurocysticercosis   . Obesity 12/16/2008  . 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 GERD 12/16/2008    Past Medical History  Diagnosis Date  . GERD (gastroesophageal reflux disease)   . Seizures (HCC)     (From neurocysticercosis, not after  removal)  . Diabetes mellitus   . Hyperlipidemia   . Hypogonadism male   . Obesity   . Hypertension 10/01/2010  . Neurocysticercosis     at age 46, brain surgery, from Pork Tape worm    Past Surgical History  Procedure Laterality Date  . 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 Marital Status: Married    Spouse Name: N/A  . Number of Children: N/A  . Years of Education: N/A   Occupational History  . Not on file.   Social History Main Topics  . Smoking status: Former Smoker    Types: Cigarettes  . Smokeless tobacco: Never Used  . Alcohol Use: No  . Drug Use: No  . Sexual Activity: Not on file    Other Topics Concern  . Not on file   Social History Narrative    Family History  Problem Relation Age of Onset  . Diabetes type II Father     Allergies  Allergen Reactions  . Ace Inhibitors Cough  . Carbamazepine   . Penicillins   . Phenytoin     Medication list reviewed and updated in full in [facility].   GEN: No acute illnesses, no fevers, chills. GI: No n/v/d, eating normally Pulm: No SOB Interactive and getting along well at home.  Otherwise, ROS is as per the HPI.  Objective:   BP 132/86 mmHg  Pulse 84  Temp(Src) 98 F (36.7 C) (Oral)  Ht  (1.803 m)  Wt 272 lb 12 oz (123.719 kg)  BMI 38.06 kg/m2  SpO2 98%  GEN: WDWN, NAD, Non-toxic, A & O x 3 HEENT: Atraumatic, Normocephalic. Neck supple. No masses, No LAD. Ears and Nose: No external deformity. CV: RRR, No M/G/R. No JVD. No thrill. No extra heart sounds. PULM: CTA B, no wheezes, crackles, rhonchi. No retractions. No resp. distress. No accessory muscle use. EXTR: No c/c/e NEURO Normal gait.  PSYCH: Normally interactive. Conversant. Not depressed or anxious appearing.  Calm demeanor.   Laboratory and Imaging Data:  Assessment and Plan:   Uncontrolled type 2 diabetes mellitus without complication, without long-term current use of insulin (HCC) - Plan: Hemoglobin A1c  Hyperlipidemia LDL goal <70 - Plan: Lipid panel  Essential hypertension  BS has been high - increase in meds will depend on a1c today  Follow-up: time depend on labs  New Prescriptions   No medications on file   Modified Medications   No medications on file   Orders Placed This Encounter  Procedures  . Hemoglobin A1c  . Lipid panel    Signed,  Karleen Hampshire T. Henry Utsey, MD   Patient's Medications  New Prescriptions   No medications on file  Previous Medications   FISH OIL-OMEGA-3 FATTY ACIDS 1000 MG CAPSULE    Take 1 g by mouth daily.     GLIPIZIDE (GLUCOTROL) 10 MG TABLET    Take 1 tablet (10 mg total) by  mouth 2 (two) times daily before a meal.   GLIPIZIDE (GLUCOTROL) 5 MG TABLET    Take 1 tablet (5 mg total) by mouth 2 (two) times daily before a meal.   GLUCOSE BLOOD TEST STRIP    1 each by Other route 2 (two) times daily. Use as instructed    LANCETS MISC    Check blood sugar 2 times daily    LOSARTAN (COZAAR) 50 MG TABLET    TAKE 1 TABLET (50 MG TOTAL) BY  MOUTH DAILY.   METFORMIN (GLUCOPHAGE-XR) 500 MG 24 HR TABLET    TAKE 4 TABLETS (2,000 MG TOTAL) BY MOUTH DAILY.   SIMVASTATIN (ZOCOR) 40 MG TABLET    TAKE 1 TABLET (40 MG TOTAL) BY MOUTH AT BEDTIME.  Modified Medications   No medications on file  Discontinued Medications   No medications on file

## 2015-05-01 NOTE — Telephone Encounter (Signed)
Pt was seen 04/29/15

## 2015-05-06 ENCOUNTER — Ambulatory Visit: Payer: BLUE CROSS/BLUE SHIELD | Admitting: Family Medicine

## 2015-05-07 ENCOUNTER — Telehealth: Payer: Self-pay | Admitting: Family Medicine

## 2015-05-07 NOTE — Telephone Encounter (Signed)
Patient's wife returned Donna's call.  She said it might be about patient's flu shot.  Patient had his flu shot the end of September at his [place].

## 2015-05-07 NOTE — Telephone Encounter (Signed)
Left message for Mr. Benjamin Proctor that I need to talk to him in regards to his recent lab results (see Result Note).  Advised I would need to talk to him directly due to not having a DPR in his chart giving us permission to talk with his wife.

## 2015-05-11 ENCOUNTER — Telehealth: Payer: Self-pay | Admitting: *Deleted

## 2015-05-11 ENCOUNTER — Other Ambulatory Visit: Payer: Self-pay | Admitting: Family Medicine

## 2015-05-11 MED ORDER — ALOGLIPTIN BENZOATE 25 MG PO TABS: 25.0000 mg | ORAL_TABLET | Freq: Every day | ORAL | Status: DC

## 2015-05-11 NOTE — Telephone Encounter (Signed)
-----   Message from Patience Muscaena M Isley, LPN sent at 56/21/308612/04/2015  4:16 PM EST ----- Mrs Ancil LinseyBravo left v/m; the generic is on back order and the generic is $195.00; name brand cost is $364.00. Request less expensive med. Request cb [phone] or pts cell.

## 2015-05-12 MED ORDER — PIOGLITAZONE HCL 30 MG PO TABS: 30.0000 mg | ORAL_TABLET | Freq: Every day | ORAL | Status: DC

## 2015-05-12 NOTE — Telephone Encounter (Signed)
I called [facility] and he can get Generic Actos 30 mg one tablet daily for #30 $8.57.  Ok to send in Rx for Actos?

## 2015-05-12 NOTE — Telephone Encounter (Signed)
That is great!  Actos 30 mg. 1 po daily  Electronically prescribe to the pharmacy of their choice. (May call in if pharmacy does not participate in electronic prescriptions) Call in #30, 11 refills. OR if they prefer a 90 day supply, #90 with 3 refills is OK, too Prescription instructions above

## 2015-05-12 NOTE — Telephone Encounter (Signed)
He has few options.   They should look at his insurance plan's formulary to see what will be the cheapest for him. He is maxed out on metformin and glipizide and wants to try something else before insulin.  They can also check these.   rosiglitazone and pioglitazone januvia and onglyza  NPH insulin is likely the cheapest option.  They can also look for manufacturers coupons on their website, which often lowers co-pays by more than 100 dollars.   It will take some legwork on their part

## 2015-05-13 NOTE — Telephone Encounter (Signed)
Left message for Mr. Benjamin Proctor that we have sent in a new diabetic medication called Actos.  He will take this once a day along with he other diabetic medication.  Per Pharmacist at [facility] it will only cost him $8.57 for a 30 day supply.

## 2015-06-17 ENCOUNTER — Telehealth: Payer: Self-pay

## 2015-06-17 NOTE — Telephone Encounter (Signed)
Pt's wife left v/m requesting pharmacy profile be updated and pts [facility] is [facility]. Done.

## 2015-09-21 ENCOUNTER — Other Ambulatory Visit: Payer: Self-pay | Admitting: Family Medicine

## 2015-10-27 ENCOUNTER — Other Ambulatory Visit: Payer: Self-pay | Admitting: Family Medicine

## 2015-12-07 ENCOUNTER — Other Ambulatory Visit: Payer: Self-pay | Admitting: Family Medicine

## 2016-01-05 ENCOUNTER — Other Ambulatory Visit: Payer: Self-pay | Admitting: Family Medicine

## 2016-02-15 ENCOUNTER — Other Ambulatory Visit: Payer: Self-pay | Admitting: Family Medicine

## 2016-07-11 ENCOUNTER — Other Ambulatory Visit: Payer: Self-pay | Admitting: Family Medicine

## 2016-08-29 ENCOUNTER — Other Ambulatory Visit: Payer: Self-pay | Admitting: Family Medicine

## 2016-08-29 NOTE — Telephone Encounter (Signed)
Last office visit 04/29/2015.  Refill?  No future appointments.

## 2016-08-29 NOTE — Telephone Encounter (Signed)
Ok to refill #60, 0 ref  No futher refills until OV. Schedule DM f/u - pref in AM so he can have labs.

## 2018-11-27 ENCOUNTER — Other Ambulatory Visit: Payer: Self-pay

## 2018-11-27 DIAGNOSIS — Z20822 Contact with and (suspected) exposure to covid-19: Secondary | ICD-10-CM

## 2018-12-03 LAB — NOVEL CORONAVIRUS, NAA: SARS-CoV-2, NAA: NOT DETECTED

## 2018-12-10 ENCOUNTER — Other Ambulatory Visit: Payer: Self-pay

## 2018-12-10 DIAGNOSIS — Z20822 Contact with and (suspected) exposure to covid-19: Secondary | ICD-10-CM

## 2018-12-14 ENCOUNTER — Telehealth: Payer: Self-pay

## 2018-12-14 LAB — NOVEL CORONAVIRUS, NAA: SARS-CoV-2, NAA: NOT DETECTED

## 2018-12-14 NOTE — Telephone Encounter (Signed)
Left message for patient to call back, see result note. 

## 2023-09-22 ENCOUNTER — Encounter: Payer: Self-pay | Admitting: Nurse Practitioner

## 2023-09-22 ENCOUNTER — Ambulatory Visit: Payer: Self-pay | Admitting: Nurse Practitioner

## 2023-09-22 VITALS — BP 128/86 | HR 109 | Temp 97.6°F | Ht 71.0 in | Wt 220.2 lb

## 2023-09-22 DIAGNOSIS — R5383 Other fatigue: Secondary | ICD-10-CM

## 2023-09-22 DIAGNOSIS — I10 Essential (primary) hypertension: Secondary | ICD-10-CM

## 2023-09-22 DIAGNOSIS — E785 Hyperlipidemia, unspecified: Secondary | ICD-10-CM | POA: Diagnosis not present

## 2023-09-22 DIAGNOSIS — Z114 Encounter for screening for human immunodeficiency virus [HIV]: Secondary | ICD-10-CM

## 2023-09-22 DIAGNOSIS — Z794 Long term (current) use of insulin: Secondary | ICD-10-CM

## 2023-09-22 DIAGNOSIS — R110 Nausea: Secondary | ICD-10-CM

## 2023-09-22 DIAGNOSIS — N529 Male erectile dysfunction, unspecified: Secondary | ICD-10-CM

## 2023-09-22 DIAGNOSIS — E1165 Type 2 diabetes mellitus with hyperglycemia: Secondary | ICD-10-CM

## 2023-09-22 LAB — CBC WITH DIFFERENTIAL/PLATELET
Basophils Absolute: 0 10*3/uL (ref 0.0–0.1)
Basophils Relative: 0.8 % (ref 0.0–3.0)
Eosinophils Absolute: 0 10*3/uL (ref 0.0–0.7)
Eosinophils Relative: 1 % (ref 0.0–5.0)
HCT: 46.7 % (ref 39.0–52.0)
Hemoglobin: 16.2 g/dL (ref 13.0–17.0)
Lymphocytes Relative: 50.7 % — ABNORMAL HIGH (ref 12.0–46.0)
Lymphs Abs: 2 10*3/uL (ref 0.7–4.0)
MCHC: 34.7 g/dL (ref 30.0–36.0)
MCV: 84.5 fl (ref 78.0–100.0)
Monocytes Absolute: 0.5 10*3/uL (ref 0.1–1.0)
Monocytes Relative: 12.7 % — ABNORMAL HIGH (ref 3.0–12.0)
Neutro Abs: 1.4 10*3/uL (ref 1.4–7.7)
Neutrophils Relative %: 34.8 % — ABNORMAL LOW (ref 43.0–77.0)
Platelets: 266 10*3/uL (ref 150.0–400.0)
RBC: 5.52 Mil/uL (ref 4.22–5.81)
RDW: 13 % (ref 11.5–15.5)
WBC: 4 10*3/uL (ref 4.0–10.5)

## 2023-09-22 LAB — LIPID PANEL
Cholesterol: 264 mg/dL — ABNORMAL HIGH (ref 0–200)
HDL: 46.8 mg/dL (ref >39.00)
LDL Cholesterol: 175 mg/dL — ABNORMAL HIGH (ref 0–99)
NonHDL: 217.62
Total CHOL/HDL Ratio: 6
Triglycerides: 212 mg/dL — ABNORMAL HIGH (ref 0.0–149.0)
VLDL: 42.4 mg/dL — ABNORMAL HIGH (ref 0.0–40.0)

## 2023-09-22 LAB — VITAMIN B12: Vitamin B-12: 317 pg/mL (ref 211–911)

## 2023-09-22 LAB — TESTOSTERONE: Testosterone: 512.92 ng/dL (ref 300.00–890.00)

## 2023-09-22 LAB — MICROALBUMIN / CREATININE URINE RATIO
Creatinine,U: 83.9 mg/dL
Microalb Creat Ratio: UNDETERMINED mg/g (ref 0.0–30.0)
Microalb, Ur: <0.7 mg/dL

## 2023-09-22 LAB — VITAMIN D 25 HYDROXY (VIT D DEFICIENCY, FRACTURES): VITD: 22.96 ng/mL — ABNORMAL LOW (ref 30.00–100.00)

## 2023-09-22 LAB — HEMOGLOBIN A1C: Hgb A1c MFr Bld: 16.1 % — ABNORMAL HIGH (ref 4.6–6.5)

## 2023-09-22 LAB — TSH: TSH: 0.72 u[IU]/mL (ref 0.35–5.50)

## 2023-09-22 NOTE — Progress Notes (Signed)
 Established Patient Office Visit  Subjective:  Patient ID: Benjamin Proctor, male    DOB: Sep 25, 1980  Age: 43 y.o. MRN: [id number]  CC:  Chief Complaint  Patient presents with   Establish Care    HPI  Benjamin Proctor presents to establish care.    He has not seen a healthcare provider in two years and has not taken his diabetes medication during this time. He reports significant weight loss, decreased appetite, early satiety, and occasional morning nausea that resolves with eating.  He has a history of high blood pressure, high cholesterol, and GERD. He experiences occasional heartburn and has not been on any blood pressure medication recently. 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 He is allergic to penicillin and Dilantin.  He reports a family history of arthritis, diabetes, and high blood pressure in both parents. He is a former smoker, having quit approximately eight to nine years ago, and denies alcohol or recreational drug use. He lives with his wife and nine-year-old daughter.  He reports experiencing erectile dysfunction and questions whether it might be related to other health issues. He also notes changes in bowel habits, with bowel movements occurring every two days and requiring effort to pass. He drinks a lot of water and eats salads regularly.  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)    Past Surgical History:  Procedure Laterality Date   BRAIN SURGERY  ~1998   Had a  parasite from uncooked pork,had surgery to have removed   neurocysticercosis removal  05/30/1996   Cranial    Family History  Problem Relation Age of Onset   Diabetes type II Father    Diabetes Father    Arthritis Mother    Diabetes Mother    Hypertension Mother     Social History   Socioeconomic History   Marital status: Married    Spouse name: Not on file   Number of children: 1   Years of education: Not on file   Highest education level: Not on file  Occupational History   Not on file  Tobacco Use   Smoking status: Former    Types: Cigarettes   Smokeless tobacco: Never   Tobacco comments:    Quit in 2017  Vaping Use   Vaping status: Not on file  Substance and Sexual Activity   Alcohol use: No   Drug use: No   Sexual activity: Yes    Birth control/protection: None  Other Topics Concern   Not on file  Social History Narrative   Lives with wife and  12 year old daughter.    Social Drivers of Health   Financial Resource Strain: Low Risk  (07/06/2021)   Received from [facility]   Overall Financial Resource Strain (CARDIA)    Difficulty of Paying Living Expenses: Not very hard  Food Insecurity: No Food Insecurity (07/06/2021)  Received from [facility]   Hunger Vital Sign    Worried About Running Out of Food in the Last Year: Never true    Ran Out of Food in the Last Year: Never true  Transportation Needs: No Transportation Needs (07/06/2021)   Received from [facility] - Transportation    Lack of Transportation (Medical): No    Lack of Transportation (Non-Medical): No  Physical Activity: Unknown (09/18/2023)   Exercise Vital Sign    Days of Exercise per Week: 0 days    Minutes of Exercise per Session: Not on file  Stress: Not on file  Social Connections: Unknown (09/18/2023)   Social Connection and Isolation Panel [NHANES]    Frequency of Communication with Friends and Family: More than three times a week    Frequency of Social Gatherings  with Friends and Family: Once a week    Attends Religious Services: Not on Marketing executive or Organizations: No    Attends Engineer, structural: Not on file    Marital Status: Married  Catering manager Violence: Not on file     Outpatient Medications Prior to Visit  Medication Sig Dispense Refill   glipiZIDE  (GLUCOTROL ) 10 MG tablet TAKE 1 TABLET BY MOUTH TWICE DAILY BEFORE A MEAL 180 tablet 0   losartan  (COZAAR ) 50 MG tablet TAKE 1 TABLET BY MOUTH DAILY 15 tablet 0   Alogliptin  Benzoate 25 MG TABS Take 25 mg by mouth daily. 30 tablet 5   fish oil-omega-3 fatty acids 1000 MG capsule Take 1 g by mouth daily.       glipiZIDE  (GLUCOTROL ) 5 MG tablet Take 1 tablet (5 mg total) by mouth 2 (two) times daily before a meal. 60 tablet 0   glucose blood test strip 1 each by Other route 2 (two) times daily. Use as instructed      Lancets MISC Check blood sugar 2 times daily      metFORMIN  (GLUCOPHAGE -XR) 500 MG 24 hr tablet TAKE FOUR TABLETS EVERY DAY (Patient taking differently: Take 500 mg by mouth 2 (two) times daily with a meal.) 60 tablet 0   pioglitazone  (ACTOS ) 30 MG tablet TAKE 1 TABLET BY MOUTH DAILY.NEED APPT FOR FUTURE REFILLS 15 tablet 0   simvastatin  (ZOCOR ) 40 MG tablet TAKE 1 TABLET BY MOUTH DAILY 90 tablet 0   No facility-administered medications prior to visit.    Allergies  Allergen Reactions   Ace Inhibitors Cough   Carbamazepine    Penicillins    Phenytoin     ROS Review of Systems Negative unless indicated in HPI.    Objective:    Physical Exam Constitutional:      Appearance: Normal appearance.  HENT:     Right Ear: Tympanic membrane normal.     Left Ear: Tympanic membrane normal.     Mouth/Throat:     Mouth: Mucous membranes are moist.  Eyes:     Conjunctiva/sclera: Conjunctivae normal.     Pupils: Pupils are equal, round, and reactive to light.  Cardiovascular:     Rate and Rhythm: Normal rate and regular rhythm.     Pulses:  Normal pulses.     Heart sounds: Normal heart sounds.  Pulmonary:     Effort: Pulmonary effort is normal.     Breath sounds: Normal breath sounds.  Abdominal:     General: Bowel sounds are normal.     Palpations: Abdomen is soft.  Musculoskeletal:  Cervical back: Normal range of motion. No tenderness.  Skin:    General: Skin is warm.     Findings: No bruising.  Neurological:     General: No focal deficit present.     Mental Status: He is alert and oriented to person, place, and time. Mental status is at baseline.  Psychiatric:        Mood and Affect: Mood normal.        Behavior: Behavior normal.        Thought Content: Thought content normal.        Judgment: Judgment normal.     BP 128/86   Pulse (!) 109   Temp 97.6 F (36.4 C)   Ht 5\' 11"  (1.803 m)   Wt 220 lb 3.2 oz (99.9 kg)   SpO2 99%   BMI 30.71 kg/m  Wt Readings from Last 3 Encounters:  09/29/23 221 lb (100.2 kg)  09/22/23 220 lb 3.2 oz (99.9 kg)  04/29/15 272 lb 12 oz (123.7 kg)     Health Maintenance  Topic Date Due   Hepatitis C Screening  Never done   OPHTHALMOLOGY EXAM  09/10/2015   FOOT EXAM  02/04/2016   COVID-19 Vaccine (1 - 2024-25 season) Never done   DTaP/Tdap/Td (2 - Td or Tdap) 10/11/2023   INFLUENZA VACCINE  12/29/2023   HEMOGLOBIN A1C  03/23/2024   Diabetic kidney evaluation - eGFR measurement  09/21/2024   Diabetic kidney evaluation - Urine ACR  09/21/2024   Pneumococcal Vaccine 23-65 Years old (3 of 3 - PCV20 or PCV21) 12/18/2030   HIV Screening  Completed   HPV VACCINES  Aged Out   Meningococcal B Vaccine  Aged Out    There are no preventive care reminders to display for this patient.  Lab Results  Component Value Date   TSH 0.72 09/22/2023   Lab Results  Component Value Date   WBC 4.0 09/22/2023   HGB 16.2 09/22/2023   HCT 46.7 09/22/2023   MCV 84.5 09/22/2023   PLT 266.0 09/22/2023   Lab Results  Component Value Date   NA 133 (L) 09/22/2023   K 4.5 09/22/2023    CO2 23 09/22/2023   GLUCOSE 336 (H) 09/22/2023   BUN 22 09/22/2023   CREATININE 0.84 09/22/2023   BILITOT 1.0 09/22/2023   ALKPHOS 64 09/08/2014   AST 13 09/22/2023   ALT 18 09/22/2023   PROT 6.4 09/22/2023   ALBUMIN 3.9 09/08/2014   CALCIUM  9.1 09/22/2023   ANIONGAP 7 01/27/2012   GFR 108.37 09/08/2014   Lab Results  Component Value Date   CHOL 264 (H) 09/22/2023   Lab Results  Component Value Date   HDL 46.80 09/22/2023   Lab Results  Component Value Date   LDLCALC 175 (H) 09/22/2023   Lab Results  Component Value Date   TRIG 212.0 (H) 09/22/2023   Lab Results  Component Value Date   CHOLHDL 6 09/22/2023   Lab Results  Component Value Date   HGBA1C 16.1 (H) 09/22/2023      Assessment & Plan:  Uncontrolled type 2 diabetes mellitus with hyperglycemia (HCC) Assessment & Plan: Poorly controlled diabetes with last A1c of 12.3 in feb, 2023. Not on medication for two years. Discussed medication importance and organ impact. Considered CGM for glucose management. - Ordered labs as outlined.  - Discussed importance of medication compliance.   Orders: -     Microalbumin / creatinine urine ratio -     Hemoglobin A1c -  COMPLETE METABOLIC PANEL WITHOUT GFR -     CBC with Differential/Platelet -     Lipid panel  Other fatigue Assessment & Plan: Will check labs as outlined.   Orders: -     Testosterone  -     Vitamin B12 -     VITAMIN D  25 Hydroxy (Vit-D Deficiency, Fractures) -     TSH  Encounter for screening for HIV -     HIV Antibody (routine testing w rflx)  Hyperlipidemia LDL goal <70 Assessment & Plan: Emphasized cholesterol control to reduce cardiovascular risk. Recommended dietary modifications. - Order lipid panel. - Discuss dietary modifications to manage cholesterol levels.   Erectile dysfunction, unspecified erectile dysfunction type Assessment & Plan: Concerns about low testosterone .  - Order testosterone  level test. - Consider referral  to urology if testosterone  levels are low.    Primary hypertension Assessment & Plan: Blood pressure around 120/90. Emphasized maintaining <120/80 for kidney protection. Recommended low dose medication. - Provide blood pressure log for home monitoring. - Schedule follow-up in two weeks to review blood pressure readings.     Follow-up: Return in about 2 weeks (around 10/06/2023) for hypertension.   Owais Pruett, NP

## 2023-09-22 NOTE — Patient Instructions (Addendum)
 Please monitor blood pressure daily at the same time and bring the reading in 2 weeks.  Diabetes Mellitus and Nutrition, Adult When you have diabetes, or diabetes mellitus, it is very important to have healthy eating habits because your blood sugar (glucose) levels are greatly affected by what you eat and drink. Eating healthy foods in the right amounts, at about the same times every day, can help you: Manage your blood glucose. Lower your risk of heart disease. Improve your blood pressure. Reach or maintain a healthy weight. What can affect my meal plan? Every person with diabetes is different, and each person has different needs for a meal plan. Your health care provider may recommend that you work with a dietitian to make a meal plan that is best for you. Your meal plan may vary depending on factors such as: The calories you need. The medicines you take. Your weight. Your blood glucose, blood pressure, and cholesterol levels. Your activity level. Other health conditions you have, such as heart or kidney disease. How do carbohydrates affect me? Carbohydrates, also called carbs, affect your blood glucose level more than any other type of food. Eating carbs raises the amount of glucose in your blood. It is important to know how many carbs you can safely have in each meal. This is different for every person. Your dietitian can help you calculate how many carbs you should have at each meal and for each snack. How does alcohol affect me? Alcohol can cause a decrease in blood glucose (hypoglycemia), especially if you use insulin or take certain diabetes medicines by mouth. Hypoglycemia can be a life-threatening condition. Symptoms of hypoglycemia, such as sleepiness, dizziness, and confusion, are similar to symptoms of having too much alcohol. Do not drink alcohol if: Your health care provider tells you not to drink. You are pregnant, may be pregnant, or are planning to become pregnant. If you  drink alcohol: Limit how much you have to: 0-1 drink a day for women. 0-2 drinks a day for men. Know how much alcohol is in your drink. In the [place] one drink equals one 12 oz bottle of beer (355 mL), one 5 oz glass of wine (148 mL), or one 1 oz glass of hard liquor (44 mL). Keep yourself hydrated with water, diet soda, or unsweetened iced tea. Keep in mind that regular soda, juice, and other mixers may contain a lot of sugar and must be counted as carbs. What are tips for following this plan?  Reading food labels Start by checking the serving size on the Nutrition Facts label of packaged foods and drinks. The number of calories and the amount of carbs, fats, and other nutrients listed on the label are based on one serving of the item. Many items contain more than one serving per package. Check the total grams (g) of carbs in one serving. Check the number of grams of saturated fats and trans fats in one serving. Choose foods that have a low amount or none of these fats. Check the number of milligrams (mg) of salt (sodium) in one serving. Most people should limit total sodium intake to less than 2,300 mg per day. Always check the nutrition information of foods labeled as "low-fat" or "nonfat." These foods may be higher in added sugar or refined carbs and should be avoided. Talk to your dietitian to identify your daily goals for nutrients listed on the label. Shopping Avoid buying canned, pre-made, or processed foods. These foods tend to be high in fat,  sodium, and added sugar. Shop around the outside edge of the grocery store. This is where you will most often find fresh fruits and vegetables, bulk grains, fresh meats, and fresh dairy products. Cooking Use low-heat cooking methods, such as baking, instead of high-heat cooking methods, such as deep frying. Cook using healthy oils, such as olive, canola, or sunflower oil. Avoid cooking with butter, cream, or high-fat meats. Meal planning Eat  meals and snacks regularly, preferably at the same times every day. Avoid going long periods of time without eating. Eat foods that are high in fiber, such as fresh fruits, vegetables, beans, and whole grains. Eat 4-6 oz (112-168 g) of lean protein each day, such as lean meat, chicken, fish, eggs, or tofu. One ounce (oz) (28 g) of lean protein is equal to: 1 oz (28 g) of meat, chicken, or fish. 1 egg.  cup (62 g) of tofu. Eat some foods each day that contain healthy fats, such as avocado, nuts, seeds, and fish. What foods should I eat? Fruits Berries. Apples. Oranges. Peaches. Apricots. Plums. Grapes. Mangoes. Papayas. Pomegranates. Kiwi. Cherries. Vegetables Leafy greens, including lettuce, spinach, kale, chard, collard greens, mustard greens, and cabbage. Beets. Cauliflower. Broccoli. Carrots. Green beans. Tomatoes. Peppers. Onions. Cucumbers. Brussels sprouts. Grains Whole grains, such as whole-wheat or whole-grain bread, crackers, tortillas, cereal, and pasta. Unsweetened oatmeal. Quinoa. Brown or wild rice. Meats and other proteins Seafood. Poultry without skin. Lean cuts of poultry and beef. Tofu. Nuts. Seeds. Dairy Low-fat or fat-free dairy products such as milk, yogurt, and cheese. The items listed above may not be a complete list of foods and beverages you can eat and drink. Contact a dietitian for more information. What foods should I avoid? Fruits Fruits canned with syrup. Vegetables Canned vegetables. Frozen vegetables with butter or cream sauce. Grains Refined white flour and flour products such as bread, pasta, snack foods, and cereals. Avoid all processed foods. Meats and other proteins Fatty cuts of meat. Poultry with skin. Breaded or fried meats. Processed meat. Avoid saturated fats. Dairy Full-fat yogurt, cheese, or milk. Beverages Sweetened drinks, such as soda or iced tea. The items listed above may not be a complete list of foods and beverages you should avoid.  Contact a dietitian for more information. Questions to ask a health care provider Do I need to meet with a certified diabetes care and education specialist? Do I need to meet with a dietitian? What number can I call if I have questions? When are the best times to check my blood glucose? Where to find more information: American Diabetes Association: diabetes.org Academy of Nutrition and Dietetics: eatright.[employer] of Diabetes and Digestive and Kidney Diseases: StageSync.si Association of Diabetes Care & Education Specialists: diabeteseducator.org Summary It is important to have healthy eating habits because your blood sugar (glucose) levels are greatly affected by what you eat and drink. It is important to use alcohol carefully. A healthy meal plan will help you manage your blood glucose and lower your risk of heart disease. Your health care provider may recommend that you work with a dietitian to make a meal plan that is best for you. This information is not intended to replace advice given to you by your health care provider. Make sure you discuss any questions you have with your health care provider. Document Revised: 12/17/2019 Document Reviewed: 12/18/2019 Elsevier Patient Education  2024 [employer].

## 2023-09-23 LAB — COMPLETE METABOLIC PANEL WITHOUT GFR
AG Ratio: 1.6 (calc) (ref 1.0–2.5)
ALT: 18 U/L (ref 9–46)
AST: 13 U/L (ref 10–40)
Albumin: 3.9 g/dL (ref 3.6–5.1)
Alkaline phosphatase (APISO): 84 U/L (ref 36–130)
BUN: 22 mg/dL (ref 7–25)
CO2: 23 mmol/L (ref 20–32)
Calcium: 9.1 mg/dL (ref 8.6–10.3)
Chloride: 98 mmol/L (ref 98–110)
Creat: 0.84 mg/dL (ref 0.60–1.29)
Globulin: 2.5 g/dL (ref 1.9–3.7)
Glucose, Bld: 336 mg/dL — ABNORMAL HIGH (ref 65–99)
Potassium: 4.5 mmol/L (ref 3.5–5.3)
Sodium: 133 mmol/L — ABNORMAL LOW (ref 135–146)
Total Bilirubin: 1 mg/dL (ref 0.2–1.2)
Total Protein: 6.4 g/dL (ref 6.1–8.1)

## 2023-09-23 LAB — HIV ANTIBODY (ROUTINE TESTING W REFLEX): HIV 1&2 Ab, 4th Generation: NONREACTIVE

## 2023-09-29 ENCOUNTER — Ambulatory Visit: Admitting: Nurse Practitioner

## 2023-09-29 ENCOUNTER — Encounter: Payer: Self-pay | Admitting: Nurse Practitioner

## 2023-09-29 VITALS — BP 118/78 | HR 109 | Temp 97.4°F | Ht 71.0 in | Wt 221.0 lb

## 2023-09-29 DIAGNOSIS — Z7984 Long term (current) use of oral hypoglycemic drugs: Secondary | ICD-10-CM

## 2023-09-29 DIAGNOSIS — Z794 Long term (current) use of insulin: Secondary | ICD-10-CM

## 2023-09-29 DIAGNOSIS — E785 Hyperlipidemia, unspecified: Secondary | ICD-10-CM | POA: Diagnosis not present

## 2023-09-29 DIAGNOSIS — E1165 Type 2 diabetes mellitus with hyperglycemia: Secondary | ICD-10-CM | POA: Diagnosis not present

## 2023-09-29 MED ORDER — DEXCOM G7 SENSOR MISC: 5 refills | Status: DC

## 2023-09-29 MED ORDER — FREESTYLE LIBRE 3 SENSOR MISC: 6 refills | Status: AC

## 2023-09-29 MED ORDER — LANTUS SOLOSTAR 100 UNIT/ML ~~LOC~~ SOPN: 10.0000 [IU] | PEN_INJECTOR | Freq: Every day | SUBCUTANEOUS | 99 refills | Status: DC

## 2023-09-29 MED ORDER — METFORMIN HCL ER 750 MG PO TB24
750.0000 mg | ORAL_TABLET | Freq: Every day | ORAL | 1 refills | Status: DC
Start: 2023-09-29 — End: 2023-10-13

## 2023-09-29 NOTE — Progress Notes (Signed)
 Established Patient Office Visit  Subjective:  Patient ID: Benjamin Proctor, male    DOB: Dec 18, 1980  Age: 43 y.o. MRN: [id number]  CC:  Chief Complaint  Patient presents with   Acute Visit    Discuss A1c result    HPI  Benjamin Proctor presents for follow up on lab results.   Diabetes: Is not taking medication at present with A1c is 16%. Symptoms include nausea, fatigue, and polyuria, likely due to hyperglycemia. He occasionally craves sweets and manages this by consuming fruits like oranges.   Dietary habits include three meals daily, with breakfast options like a ham sandwich or yogurt parfait. He drinks a lot of water and sometimes eats eggs. He prefers rice and has been reducing portion sizes. He works long hours in a Investment banker, operational, which involves physical activity. He takes three breaks during his workday for meals and hydration. He has been managing his weight and diet over the past four weeks, resulting in some weight loss.  Elevated cholesterol levels with triglycerides at 212 mg/dL, total cholesterol at 409 mg/dL, and LDL at 811 mg/dL. HDL is within a good range. Vitamin D  levels are slightly low. TSH and testosterone  levels, as well as liver and kidney functions, are normal.  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)    Past Surgical History:  Procedure Laterality Date   BRAIN SURGERY  ~1998   Had a parasite from uncooked pork,had surgery to have removed   neurocysticercosis removal  05/30/1996   Cranial    Family History  Problem Relation  Age of Onset   Diabetes type II Father    Diabetes Father    Arthritis Mother    Diabetes Mother    Hypertension Mother     Social History   Socioeconomic History   Marital status: Married    Spouse name: Not on file   Number of children: 1   Years of education: Not on file   Highest education level: Not on file  Occupational History   Not on file  Tobacco Use   Smoking status: Former    Types: Cigarettes   Smokeless tobacco: Never   Tobacco comments:    Quit in 2017  Vaping Use   Vaping status: Not on file  Substance and Sexual Activity   Alcohol use: No   Drug use: No   Sexual activity: Yes    Birth control/protection: None  Other Topics Concern   Not on file  Social History Narrative   Lives with wife and  2 year old daughter.    Social Drivers of Corporate investment banker Strain: Low Risk  (07/06/2021)   Received from [facility]   Overall Financial Resource Strain (CARDIA)    Difficulty of Paying Living Expenses: Not very hard  Food Insecurity: No Food Insecurity (07/06/2021)   Received from [facility]   Hunger Vital Sign    Worried About Running Out of Food in  the Last Year: Never true    Ran Out of Food in the Last Year: Never true  Transportation Needs: No Transportation Needs (07/06/2021)   Received from [facility] - Transportation    Lack of Transportation (Medical): No    Lack of Transportation (Non-Medical): No  Physical Activity: Unknown (09/18/2023)   Exercise Vital Sign    Days of Exercise per Week: 0 days    Minutes of Exercise per Session: Not on file  Stress: Not on file  Social Connections: Unknown (09/18/2023)   Social Connection and Isolation Panel [NHANES]    Frequency of Communication with Friends and Family: More than three times a week    Frequency of Social Gatherings with Friends and Family: Once a week    Attends Religious Services: Not on Marketing executive or Organizations: No    Attends Museum/gallery exhibitions officer: Not on file    Marital Status: Married  Catering manager Violence: Not on file     Outpatient Medications Prior to Visit  Medication Sig Dispense Refill   glipiZIDE  (GLUCOTROL ) 10 MG tablet TAKE 1 TABLET BY MOUTH TWICE DAILY BEFORE A MEAL 180 tablet 0   losartan  (COZAAR ) 50 MG tablet TAKE 1 TABLET BY MOUTH DAILY 15 tablet 0   metFORMIN  (GLUCOPHAGE -XR) 500 MG 24 hr tablet TAKE FOUR TABLETS EVERY DAY (Patient taking differently: Take 500 mg by mouth 2 (two) times daily with a meal.) 60 tablet 0   No facility-administered medications prior to visit.    Allergies  Allergen Reactions   Ace Inhibitors Cough   Carbamazepine    Penicillins    Phenytoin     ROS Review of Systems Negative unless indicated in HPI.    Objective:    Physical Exam  BP 118/78   Pulse (!) 109   Temp (!) 97.4 F (36.3 C)   Ht 5\' 11"  (1.803 m)   Wt 221 lb (100.2 kg)   SpO2 98%   BMI 30.82 kg/m  Wt Readings from Last 3 Encounters:  10/13/23 222 lb (100.7 kg)  09/29/23 221 lb (100.2 kg)  09/22/23 220 lb 3.2 oz (99.9 kg)     Health Maintenance  Topic Date Due   Hepatitis C Screening  Never done   OPHTHALMOLOGY EXAM  09/10/2015   FOOT EXAM  02/04/2016   DTaP/Tdap/Td (2 - Td or Tdap) 10/11/2023   COVID-19 Vaccine (1 - 2024-25 season) 10/27/2023 (Originally 01/29/2023)   INFLUENZA VACCINE  12/29/2023   HEMOGLOBIN A1C  03/23/2024   Diabetic kidney evaluation - eGFR measurement  09/21/2024   Diabetic kidney evaluation - Urine ACR  09/21/2024   Pneumococcal Vaccine 7-40 Years old (3 of 3 - PCV20 or PCV21) 12/18/2030   HIV Screening  Completed   HPV VACCINES  Aged Out   Meningococcal B Vaccine  Aged Out    There are no preventive care reminders to display for this patient.  Lab Results  Component Value Date   TSH 0.72 09/22/2023   Lab Results  Component Value Date   WBC 4.0 09/22/2023   HGB 16.2 09/22/2023   HCT 46.7 09/22/2023   MCV 84.5 09/22/2023   PLT 266.0  09/22/2023   Lab Results  Component Value Date   NA 133 (L) 09/22/2023   K 4.5 09/22/2023   CO2 23 09/22/2023   GLUCOSE 336 (H) 09/22/2023   BUN 22 09/22/2023   CREATININE 0.84 09/22/2023   BILITOT 1.0 09/22/2023  ALKPHOS 64 09/08/2014   AST 13 09/22/2023   ALT 18 09/22/2023   PROT 6.4 09/22/2023   ALBUMIN 3.9 09/08/2014   CALCIUM  9.1 09/22/2023   ANIONGAP 7 01/27/2012   GFR 108.37 09/08/2014   Lab Results  Component Value Date   CHOL 264 (H) 09/22/2023   Lab Results  Component Value Date   HDL 46.80 09/22/2023   Lab Results  Component Value Date   LDLCALC 175 (H) 09/22/2023   Lab Results  Component Value Date   TRIG 212.0 (H) 09/22/2023   Lab Results  Component Value Date   CHOLHDL 6 09/22/2023   Lab Results  Component Value Date   HGBA1C 16.1 (H) 09/22/2023      Assessment & Plan:  Type 2 diabetes mellitus with hyperglycemia, unspecified whether long term insulin use (HCC) Assessment & Plan: Severe hyperglycemia with A1c of 16% and glucose level of 330 mg/dL. Emphasized regular blood glucose monitoring and dietary modifications. Explained insulin administration using prefilled pens and site rotation.  - Will start on Lantus  10 units at bedtime and metformin  750 mg. - Sample of CGM monitor provided. - Referred to nutritionist. - Advise on portion control and low glycemic index foods. - Encourage physical activity, e.g., walking during breaks.     Orders: -     FreeStyle Libre 3 Sensor; Place 1 sensor on the skin every 14 days. Use to check glucose continuously  Dispense: 2 each; Refill: 6 -     Referral to Nutrition and Diabetes Services -     Dexcom G7 Sensor; Place 1 sensor on the skin every 10 days. Use to check glucose continuously  Dispense: 2 each; Refill: 5  Hyperlipidemia LDL goal <70 Assessment & Plan: Elevated cholesterol with total cholesterol at 264 mg/dL, triglycerides at 478 mg/dL, and LDL at 295 mg/dL. HDL normal. Focus on dietary  management. - Advise dietary modifications. - Reassess cholesterol levels after dietary changes in 6 weeks.     Follow-up: Return in about 2 weeks (around 10/13/2023) for diabetes.   Lemmie Steinhaus, NP

## 2023-09-29 NOTE — Patient Instructions (Signed)

## 2023-09-29 NOTE — Assessment & Plan Note (Signed)
 Severe Hyperglycemia with A1c of 16%. Initiated insulin therapy due to high A1c. Discussed diet management and gradual medication introduction to avoid hypoglycemia. Explained insulin administration using prefilled pens and site rotation. Emphasized regular blood glucose monitoring and dietary modifications. - Start Lantus 10 units at bedtime. - Start metformin  750 mg with breakfast. - Consider Ozempic in future. - Place CGM and download data to phone. - Refer to nutritionist. - Advise on portion control and low glycemic index foods. - Encourage physical activity, e.g., walking during breaks.

## 2023-10-05 ENCOUNTER — Other Ambulatory Visit (HOSPITAL_COMMUNITY): Payer: Self-pay

## 2023-10-05 ENCOUNTER — Telehealth: Payer: Self-pay

## 2023-10-05 NOTE — Telephone Encounter (Signed)
 Pharmacy Patient Advocate Encounter   Received notification from CoverMyMeds that prior authorization for [facility] G7 SENSOR is required/requested.   Insurance verification completed.   The patient is insured through [facility] MEDICAID .   Per test claim: PA required; PA submitted to above mentioned insurance via Fax Key/confirmation #/EOC [id number] Status is pending

## 2023-10-06 ENCOUNTER — Encounter (HOSPITAL_COMMUNITY): Payer: Self-pay

## 2023-10-08 DIAGNOSIS — R5383 Other fatigue: Secondary | ICD-10-CM | POA: Insufficient documentation

## 2023-10-08 DIAGNOSIS — N529 Male erectile dysfunction, unspecified: Secondary | ICD-10-CM | POA: Insufficient documentation

## 2023-10-08 NOTE — Assessment & Plan Note (Signed)
 Blood pressure around 120/90. Emphasized maintaining <120/80 for kidney protection. Recommended low dose medication. - Provide blood pressure log for home monitoring. - Schedule follow-up in two weeks to review blood pressure readings.

## 2023-10-08 NOTE — Assessment & Plan Note (Signed)
 Concerns about low testosterone .  - Order testosterone  level test. - Consider referral to urology if testosterone  levels are low.

## 2023-10-08 NOTE — Assessment & Plan Note (Signed)
 Will check labs as outlined.

## 2023-10-08 NOTE — Assessment & Plan Note (Signed)
 Emphasized cholesterol control to reduce cardiovascular risk. Recommended dietary modifications. - Order lipid panel. - Discuss dietary modifications to manage cholesterol levels.

## 2023-10-08 NOTE — Assessment & Plan Note (Signed)
 Poorly controlled diabetes with last A1c of 12.3 in feb, 2023. Not on medication for two years. Discussed medication importance and organ impact. Considered CGM for glucose management. - Ordered labs as outlined.  - Discussed importance of medication compliance.

## 2023-10-10 ENCOUNTER — Other Ambulatory Visit (HOSPITAL_COMMUNITY): Payer: Self-pay

## 2023-10-10 NOTE — Telephone Encounter (Signed)
 PA request has been Submitted. New Encounter has been or will be created for follow up. For additional info see Pharmacy Prior Auth telephone encounter from 10/05/2023.

## 2023-10-10 NOTE — Telephone Encounter (Signed)
 Pharmacy Patient Advocate Encounter  Received notification from [place] MEDICAID that Prior Authorization for [facility] G7 SENSOR has been APPROVED. With $0.00 copay

## 2023-10-13 ENCOUNTER — Ambulatory Visit: Admitting: Nurse Practitioner

## 2023-10-13 ENCOUNTER — Encounter: Payer: Self-pay | Admitting: Nurse Practitioner

## 2023-10-13 VITALS — BP 138/86 | HR 96 | Temp 97.4°F | Ht 71.0 in | Wt 222.0 lb

## 2023-10-13 DIAGNOSIS — E1165 Type 2 diabetes mellitus with hyperglycemia: Secondary | ICD-10-CM | POA: Diagnosis not present

## 2023-10-13 DIAGNOSIS — Z794 Long term (current) use of insulin: Secondary | ICD-10-CM

## 2023-10-13 DIAGNOSIS — I10 Essential (primary) hypertension: Secondary | ICD-10-CM | POA: Diagnosis not present

## 2023-10-13 MED ORDER — LOSARTAN POTASSIUM 50 MG PO TABS: 50.0000 mg | ORAL_TABLET | Freq: Every day | ORAL | 1 refills | Status: DC

## 2023-10-13 MED ORDER — LANTUS SOLOSTAR 100 UNIT/ML ~~LOC~~ SOPN: 20.0000 [IU] | PEN_INJECTOR | Freq: Every day | SUBCUTANEOUS | 99 refills | Status: AC

## 2023-10-13 MED ORDER — JANUMET 50-500 MG PO TABS: 1.0000 | ORAL_TABLET | Freq: Two times a day (BID) | ORAL | 1 refills | Status: DC

## 2023-10-13 NOTE — Progress Notes (Signed)
 Established Patient Office Visit  Subjective:  Patient ID: Benjamin Proctor, male    DOB: 11/25/1980  Age: 43 y.o. MRN: [id number]  CC:  Chief Complaint  Patient presents with   Medical Management of Chronic Issues   Discussed the use of a AI scribe software for clinical note transcription with the patient, who gave verbal consent to proceed.  HPI  Benjamin Proctor is a 43 year old male with diabetes who presents for follow-up of blood sugar management.  He uses a continuous glucose monitor, showing an average blood glucose level of 357 mg/dL and an X9J of 47.8%. Blood sugar levels are consistently high, especially in the afternoons. He occasionally forgets to take his medication on time.  His monitor alerts him to high levels about twice daily, often in the afternoon. Not eating between meals can lead to spikes in blood sugar. Was referred for nutrition counseling but pt declined.   He takes insulin, 10 units at bedtime, and metformin  once daily. He also takes a daily vitamin D  supplement. He has been on metformin  for two weeks with no current side effects.  His diet includes salads, [place] or tuna sandwiches on wheat bread, and fruits like apples and oranges. He avoids energy drinks and sodas, choosing water or zero-sugar Gatorade.  He feels more energetic and less thirsty.   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)    Past Surgical History:  Procedure Laterality Date   BRAIN SURGERY  ~1998   Had a parasite from uncooked pork,had surgery to have removed    neurocysticercosis removal  05/30/1996   Cranial    Family History  Problem Relation Age of Onset   Diabetes type II Father    Diabetes Father    Arthritis Mother    Diabetes Mother    Hypertension Mother     Social History   Socioeconomic History   Marital status: Married    Spouse name: Not on file   Number of children: 1   Years of education: Not on file   Highest education level: Not on file  Occupational History   Not on file  Tobacco Use   Smoking status: Former    Types: Cigarettes   Smokeless tobacco: Never   Tobacco comments:    Quit in 2017  Vaping Use   Vaping status: Not on file  Substance and Sexual Activity   Alcohol use: No   Drug use: No   Sexual activity: Yes    Birth control/protection: None  Other Topics Concern   Not on file  Social History Narrative   Lives with wife and  20 year old daughter.    Social Drivers of Corporate investment banker Strain: Low Risk  (07/06/2021)   Received from [facility]   Overall Financial Resource Strain (CARDIA)    Difficulty of Paying Living Expenses: Not very hard  Food Insecurity: No Food Insecurity (07/06/2021)   Received from [facility]  Hunger Vital Sign    Worried About Running Out of Food in the Last Year: Never true    Ran Out of Food in the Last Year: Never true  Transportation Needs: No Transportation Needs (07/06/2021)   Received from [facility] - Transportation    Lack of Transportation (Medical): No    Lack of Transportation (Non-Medical): No  Physical Activity: Unknown (09/18/2023)   Exercise Vital Sign    Days of Exercise per Week: 0 days    Minutes of Exercise per Session: Not on file  Stress: Not on file  Social Connections: Unknown (09/18/2023)   Social Connection and Isolation Panel [NHANES]    Frequency of Communication with Friends and Family: More than three times a week    Frequency of Social Gatherings with Friends and Family: Once a week    Attends Religious  Services: Not on Marketing executive or Organizations: No    Attends Engineer, structural: Not on file    Marital Status: Married  Catering manager Violence: Not on file     Outpatient Medications Prior to Visit  Medication Sig Dispense Refill   Continuous Glucose Sensor (DEXCOM G7 SENSOR) MISC Place 1 sensor on the skin every 10 days. Use to check glucose continuously 2 each 5   Continuous Glucose Sensor (FREESTYLE LIBRE 3 SENSOR) MISC Place 1 sensor on the skin every 14 days. Use to check glucose continuously 2 each 6   glipiZIDE  (GLUCOTROL ) 10 MG tablet TAKE 1 TABLET BY MOUTH TWICE DAILY BEFORE A MEAL 180 tablet 0   losartan  (COZAAR ) 50 MG tablet TAKE 1 TABLET BY MOUTH DAILY 15 tablet 0   insulin glargine  (LANTUS  SOLOSTAR) 100 UNIT/ML Solostar Pen Inject 10 Units into the skin at bedtime. 15 mL PRN   metFORMIN  (GLUCOPHAGE -XR) 750 MG 24 hr tablet Take 1 tablet (750 mg total) by mouth daily with breakfast. 90 tablet 1   No facility-administered medications prior to visit.    Allergies  Allergen Reactions   Ace Inhibitors Cough   Carbamazepine    Penicillins    Phenytoin     ROS Review of Systems Negative unless indicated in HPI.    Objective:     Physical Exam Constitutional:      Appearance: Normal appearance.  Cardiovascular:     Rate and Rhythm: Normal rate and regular rhythm.     Pulses: Normal pulses.     Heart sounds: Normal heart sounds.  Pulmonary:     Effort: Pulmonary effort is normal.     Breath sounds: Normal breath sounds. No wheezing.  Neurological:     General: No focal deficit present.     Mental Status: He is alert and oriented to person, place, and time.  Psychiatric:        Mood and Affect: Mood normal.        Behavior: Behavior normal.     BP 138/86   Pulse 96   Temp (!) 97.4 F (36.3 C)   Ht 5\' 11"  (1.803 m)   Wt 222 lb (100.7 kg)   SpO2 98%   BMI 30.96 kg/m  Wt Readings from Last 3 Encounters:  10/13/23 222 lb  (100.7 kg)  09/29/23 221 lb (100.2 kg)  09/22/23 220 lb 3.2 oz (99.9 kg)     Health Maintenance  Topic Date Due   Hepatitis C Screening  Never done   FOOT EXAM  02/04/2016   COVID-19 Vaccine (1 -  2024-25 season) Never done   DTaP/Tdap/Td (2 - Td or Tdap) 10/11/2023   INFLUENZA VACCINE  12/29/2023   HEMOGLOBIN A1C  03/23/2024   Diabetic kidney evaluation - eGFR measurement  09/21/2024   Diabetic kidney evaluation - Urine ACR  09/21/2024   OPHTHALMOLOGY EXAM  10/19/2024   Pneumococcal Vaccine 64-44 Years old (3 of 3 - PCV20 or PCV21) 12/18/2030   HIV Screening  Completed   HPV VACCINES  Aged Out   Meningococcal B Vaccine  Aged Out    There are no preventive care reminders to display for this patient.  Lab Results  Component Value Date   TSH 0.72 09/22/2023   Lab Results  Component Value Date   WBC 4.0 09/22/2023   HGB 16.2 09/22/2023   HCT 46.7 09/22/2023   MCV 84.5 09/22/2023   PLT 266.0 09/22/2023   Lab Results  Component Value Date   NA 133 (L) 09/22/2023   K 4.5 09/22/2023   CO2 23 09/22/2023   GLUCOSE 336 (H) 09/22/2023   BUN 22 09/22/2023   CREATININE 0.84 09/22/2023   BILITOT 1.0 09/22/2023   ALKPHOS 64 09/08/2014   AST 13 09/22/2023   ALT 18 09/22/2023   PROT 6.4 09/22/2023   ALBUMIN 3.9 09/08/2014   CALCIUM  9.1 09/22/2023   ANIONGAP 7 01/27/2012   GFR 108.37 09/08/2014   Lab Results  Component Value Date   CHOL 264 (H) 09/22/2023   Lab Results  Component Value Date   HDL 46.80 09/22/2023   Lab Results  Component Value Date   LDLCALC 175 (H) 09/22/2023   Lab Results  Component Value Date   TRIG 212.0 (H) 09/22/2023   Lab Results  Component Value Date   CHOLHDL 6 09/22/2023   Lab Results  Component Value Date   HGBA1C 16.1 (H) 09/22/2023      Assessment & Plan:  Type 2 diabetes mellitus with hyperglycemia, unspecified whether long term insulin use (HCC) Assessment & Plan: Type 2 diabetes with persistent hyperglycemia. Average  glucose 357 mg/dL, GMI 11.9%. Frequent high glucose alerts on Dexcom. Missed doses of metformin  and insulin. Dietary habits contribute to glucose spikes.  - Will start on Janumet  twice daily .  - Increase insulin to 20 units at bedtime. - Discontinue current metformin . - Recommend healthy snacks between meals to prevent glucose spikes.  Orders: -     Janumet ; Take 1 tablet by mouth 2 (two) times daily with a meal.  Dispense: 30 tablet; Refill: 1  Primary hypertension Assessment & Plan: Patient BP  Vitals:   10/13/23 0931  BP: 138/86    in the office. -Advised pt to follow a low sodium and heart healthy diet. - Started on losartan  50 mg daily. - Monitor blood pressure at home, maintain log.    Other orders -     Lantus  SoloStar; Inject 20 Units into the skin at bedtime.  Dispense: 15 mL; Refill: PRN -     Losartan  Potassium; Take 1 tablet (50 mg total) by mouth daily.  Dispense: 90 tablet; Refill: 1    Follow-up: Return in about 3 weeks (around 11/03/2023).   Sven Pinheiro, NP

## 2023-10-13 NOTE — Patient Instructions (Addendum)
-  Increase your insulin dose to 20 units at bedtime. -Switch to a Janumet and take it twice daily with meals. -Start taking losartan  for BP control. -Stop metformin . -Please monitor your blood pressure daily at the same time and bring to next appointment. -Remember to take your medication on time to avoid high blood sugar levels. -Have healthy snacks between meals to prevent glucose spikes Please monitor blood pressure daily twice a day and send us  the readings via MyChart.

## 2023-10-16 NOTE — Assessment & Plan Note (Signed)
 Severe hyperglycemia with A1c of 16% and glucose level of 330 mg/dL. Emphasized regular blood glucose monitoring and dietary modifications. Explained insulin administration using prefilled pens and site rotation.  - Will start on Lantus  10 units at bedtime and metformin  750 mg. - Sample of CGM monitor provided. - Referred to nutritionist. - Advise on portion control and low glycemic index foods. - Encourage physical activity, e.g., walking during breaks.

## 2023-10-16 NOTE — Assessment & Plan Note (Signed)
 Elevated cholesterol with total cholesterol at 264 mg/dL, triglycerides at 161 mg/dL, and LDL at 096 mg/dL. HDL normal. Focus on dietary management. - Advise dietary modifications. - Reassess cholesterol levels after dietary changes in 6 weeks.

## 2023-10-20 LAB — HM DIABETES EYE EXAM

## 2023-10-31 ENCOUNTER — Encounter: Payer: Self-pay | Admitting: Nurse Practitioner

## 2023-11-03 ENCOUNTER — Ambulatory Visit: Admitting: Nurse Practitioner

## 2023-11-05 NOTE — Assessment & Plan Note (Signed)
 Type 2 diabetes with persistent hyperglycemia. Average glucose 357 mg/dL, GMI 40.9%. Frequent high glucose alerts on Dexcom. Missed doses of metformin  and insulin. Dietary habits contribute to glucose spikes.  - Will start on Janumet  twice daily .  - Increase insulin to 20 units at bedtime. - Discontinue current metformin . - Recommend healthy snacks between meals to prevent glucose spikes.

## 2023-11-05 NOTE — Assessment & Plan Note (Signed)
 Patient BP  Vitals:   10/13/23 0931  BP: 138/86    in the office. -Advised pt to follow a low sodium and heart healthy diet. - Started on losartan  50 mg daily. - Monitor blood pressure at home, maintain log.

## 2023-11-11 ENCOUNTER — Other Ambulatory Visit: Payer: Self-pay | Admitting: Nurse Practitioner

## 2023-11-11 DIAGNOSIS — E1165 Type 2 diabetes mellitus with hyperglycemia: Secondary | ICD-10-CM

## 2023-11-17 ENCOUNTER — Ambulatory Visit: Admitting: Nurse Practitioner

## 2023-11-30 ENCOUNTER — Telehealth: Payer: Self-pay

## 2023-11-30 NOTE — Telephone Encounter (Signed)
 I left voicemail for patient asking him to please call us .  I also sent a message to patient via MyChart.  E2C2 - when patient calls back, please let him know that we had to reschedule his appointment with Chelsea Aurora, NP, on 12/15/2023 from 11:20am to 11:40am.  Please help him reschedule his visit if this new time does not work with his schedule.

## 2023-12-14 NOTE — Progress Notes (Signed)
 Established Patient Office Visit  Subjective:  Patient ID: Benjamin Proctor, male    DOB: December 26, 1980  Age: 43 y.o. MRN: [id number]  CC:  Chief Complaint  Patient presents with   Medical Management of Chronic Issues  Discussed the use of a AI scribe software for clinical note transcription with the patient, who gave verbal consent to proceed.   HPI  Benjamin Proctor is a 43 year old male present for follow-up on hypertension and diabetes.  He experiences inconsistent blood pressure readings at home, with values reaching 143/106 and 159/103 mmHg. He questions the accuracy of his home blood pressure machine. He is on losartan  50 mg for hypertension.  His blood sugar levels average around 300 mg/dL. A malfunctioning glucose monitor led to a week without readings during a vacation. He is on insulin, Janumet , and was previously on glipizide .   He has restless legs, especially in the left leg, which feels like it wants to cramp. He spends long hours on his feet at work in steel-toed shoes, finding relief when removing his shoes and sitting.  He experiences occasional diarrhea, attributed to dietary choices, and sometimes constipation. He uses fiber gummies to aid bowel movements. 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)    Past Surgical History:  Procedure Laterality Date   BRAIN SURGERY  ~1998   Had a parasite from uncooked pork,had surgery to have removed   neurocysticercosis removal  05/30/1996   Cranial    Family History  Problem Relation Age of Onset   Diabetes type  II Father    Diabetes Father    Arthritis Mother    Diabetes Mother    Hypertension Mother     Social History   Socioeconomic History   Marital status: Married    Spouse name: Not on file   Number of children: 1   Years of education: Not on file   Highest education level: Not on file  Occupational History   Not on file  Tobacco Use   Smoking status: Former    Types: Cigarettes   Smokeless tobacco: Never   Tobacco comments:    Quit in 2017  Vaping Use   Vaping status: Not on file  Substance and Sexual Activity   Alcohol use: No   Drug use: No   Sexual activity: Yes    Birth control/protection: None  Other Topics Concern   Not on file  Social History Narrative   Lives with wife and  37 year old daughter.    Social Drivers of Corporate investment banker Strain: Low Risk  (07/06/2021)   Received from [facility]   Overall Financial Resource Strain (CARDIA)    Difficulty of Paying Living Expenses: Not very hard  Food Insecurity: No Food Insecurity (07/06/2021)   Received from [facility]   Hunger Vital Sign    Within the past 12 months, you worried that your food would run out before you got the money  to buy more.: Never true    Within the past 12 months, the food you bought just didn't last and you didn't have money to get more.: Never true  Transportation Needs: No Transportation Needs (07/06/2021)   Received from [facility] - Transportation    Lack of Transportation (Medical): No    Lack of Transportation (Non-Medical): No  Physical Activity: Unknown (09/18/2023)   Exercise Vital Sign    Days of Exercise per Week: 0 days    Minutes of Exercise per Session: Not on file  Stress: Not on file  Social Connections: Unknown (09/18/2023)   Social Connection and Isolation Panel    Frequency of Communication with Friends and Family: More than three times a week    Frequency of Social Gatherings with Friends and Family: Once a week    Attends Religious  Services: Not on Marketing executive or Organizations: No    Attends Engineer, structural: Not on file    Marital Status: Married  Catering manager Violence: Not on file     Outpatient Medications Prior to Visit  Medication Sig Dispense Refill   Continuous Glucose Sensor (DEXCOM G7 SENSOR) MISC Place 1 sensor on the skin every 10 days. Use to check glucose continuously 2 each 5   Continuous Glucose Sensor (FREESTYLE LIBRE 3 SENSOR) MISC Place 1 sensor on the skin every 14 days. Use to check glucose continuously 2 each 6   insulin glargine  (LANTUS  SOLOSTAR) 100 UNIT/ML Solostar Pen Inject 20 Units into the skin at bedtime. 15 mL PRN   losartan  (COZAAR ) 50 MG tablet TAKE 1 TABLET BY MOUTH DAILY 15 tablet 0   losartan  (COZAAR ) 50 MG tablet Take 1 tablet (50 mg total) by mouth daily. 90 tablet 1   glipiZIDE  (GLUCOTROL ) 10 MG tablet TAKE 1 TABLET BY MOUTH TWICE DAILY BEFORE A MEAL 180 tablet 0   JANUMET  50-500 MG tablet TAKE ONE TABLET BY MOUTH TWICE DAILY WITH A MEAL 30 tablet 1   No facility-administered medications prior to visit.    Allergies  Allergen Reactions   Ace Inhibitors Cough   Carbamazepine    Penicillins    Phenytoin     ROS Review of Systems Negative unless indicated in HPI.    Objective:    Physical Exam  BP 126/80   Pulse 88   Temp 97.7 F (36.5 C)   Ht 5' 11 (1.803 m)   Wt 224 lb 6.4 oz (101.8 kg)   SpO2 98%   BMI 31.30 kg/m  Wt Readings from Last 3 Encounters:  12/27/23 224 lb (101.6 kg)  12/15/23 224 lb 6.4 oz (101.8 kg)  10/13/23 222 lb (100.7 kg)     Health Maintenance  Topic Date Due   Hepatitis C Screening  Never done   Hepatitis B Vaccines (1 of 3 - 19+ 3-dose series) Never done   HPV VACCINES (1 - 3-dose SCDM series) Never done   FOOT EXAM  02/04/2016   COVID-19 Vaccine (4 - 2024-25 season) 01/29/2023   DTaP/Tdap/Td (2 - Td or Tdap) 10/11/2023   INFLUENZA VACCINE  12/29/2023   HEMOGLOBIN A1C  06/28/2024    Diabetic kidney evaluation - Urine ACR  09/21/2024   OPHTHALMOLOGY EXAM  10/19/2024   Diabetic kidney evaluation - eGFR measurement  12/26/2024   Pneumococcal Vaccine: 19-49 Years (3 of 3 - PCV20 or PCV21) 12/18/2030   HIV Screening  Completed   Meningococcal B Vaccine  Aged Out       Topic Date Due   Hepatitis B Vaccines (1 of 3 - 19+ 3-dose series) Never done   HPV VACCINES (1 - 3-dose SCDM series) Never done    Lab Results  Component Value Date   TSH 0.72 09/22/2023   Lab Results  Component Value Date   WBC 4.0 09/22/2023   HGB 16.2 09/22/2023   HCT 46.7 09/22/2023   MCV 84.5 09/22/2023   PLT 266.0 09/22/2023   Lab Results  Component Value Date   NA 135 12/27/2023   K 4.7 12/27/2023   CO2 24 12/27/2023   GLUCOSE 229 (H) 12/27/2023   BUN 20 12/27/2023   CREATININE 1.15 12/27/2023   BILITOT 0.5 12/27/2023   ALKPHOS 64 09/08/2014   AST 11 12/27/2023   ALT 16 12/27/2023   PROT 6.0 (L) 12/27/2023   ALBUMIN 3.9 09/08/2014   CALCIUM  8.9 12/27/2023   ANIONGAP 7 01/27/2012   GFR 108.37 09/08/2014   Lab Results  Component Value Date   CHOL 212 (H) 12/27/2023   Lab Results  Component Value Date   HDL 39 (L) 12/27/2023   Lab Results  Component Value Date   LDLCALC 130 (H) 12/27/2023   Lab Results  Component Value Date   TRIG 282 (H) 12/27/2023   Lab Results  Component Value Date   CHOLHDL 5.4 (H) 12/27/2023   Lab Results  Component Value Date   HGBA1C 9.9 (H) 12/27/2023      Assessment & Plan:  Uncontrolled type 2 diabetes mellitus with hyperglycemia (HCC) Assessment & Plan: Blood glucose averages 300 mg/dL. Insulin increased to improve control. Janumet  causes abdominal discomfort but is tolerated.  - Increase insulin to 30 units. - Reintroduce glipizide  twice daily. - Monitor A1c in one week. - Consider Ozempic or Rybelsus if side effects persist. - Advise on dietary management.   Orders: -     Lipid panel; Future -     COMPLETE METABOLIC  PANEL WITHOUT GFR; Future -     Hemoglobin A1c; Future  Restless leg Assessment & Plan: Symptoms in left leg, worsened by standing. Possible anemia or low iron levels considered. - Order iron studies. - Consider medication if iron levels normal. - Recommend compression stockings.   Orders: -     Iron, TIBC and Ferritin Panel; Future  Primary hypertension Assessment & Plan: Variable home blood pressure readings, up to 159/106 mmHg. Losartan  50 mg ongoing. Concerns about home machine accuracy. - Calibrate home blood pressure machine at clinic. - Schedule lab work and nurse follow-up for blood pressure check.   Other orders -     glipiZIDE ; Take 1 tablet (10 mg total) by mouth 2 (two) times daily before a meal.  Dispense: 180 tablet; Refill: 0    Follow-up: Return in about 1 month (around 01/15/2024) for  fasting lab, nurse visit together after 7/25, .   Tayari Yankee, NP

## 2023-12-15 ENCOUNTER — Encounter: Payer: Self-pay | Admitting: Nurse Practitioner

## 2023-12-15 ENCOUNTER — Ambulatory Visit: Admitting: Nurse Practitioner

## 2023-12-15 VITALS — BP 126/80 | HR 88 | Temp 97.7°F | Ht 71.0 in | Wt 224.4 lb

## 2023-12-15 DIAGNOSIS — G2581 Restless legs syndrome: Secondary | ICD-10-CM | POA: Diagnosis not present

## 2023-12-15 DIAGNOSIS — Z794 Long term (current) use of insulin: Secondary | ICD-10-CM | POA: Diagnosis not present

## 2023-12-15 DIAGNOSIS — I10 Essential (primary) hypertension: Secondary | ICD-10-CM | POA: Diagnosis not present

## 2023-12-15 DIAGNOSIS — E1165 Type 2 diabetes mellitus with hyperglycemia: Secondary | ICD-10-CM | POA: Diagnosis not present

## 2023-12-15 DIAGNOSIS — Z7984 Long term (current) use of oral hypoglycemic drugs: Secondary | ICD-10-CM

## 2023-12-15 MED ORDER — GLIPIZIDE 10 MG PO TABS: 10.0000 mg | ORAL_TABLET | Freq: Two times a day (BID) | ORAL | 0 refills | Status: DC

## 2023-12-15 NOTE — Patient Instructions (Addendum)
 During your visit, we discussed your concerns about managing your blood pressure and blood sugar levels. We also addressed your restless legs, abdominal discomfort, and bowel issues.  YOUR PLAN:  TYPE 2 DIABETES MELLITUS: Your blood sugar levels are averaging around 300 mg/dL, and you have been experiencing some abdominal discomfort from Janumet . -Increase your insulin dosage to 30 units. -Reintroduce glipizide  twice daily.  HYPERTENSION: Your home blood pressure readings have been inconsistent, and you are concerned about the accuracy of your home machine. -We will calibrate your home blood pressure machine at the clinic. -Schedule lab work and a follow-up appointment with the nurse for a blood pressure check.  RESTLESS LEG SYNDROME: You have symptoms in your left leg that worsen with standing, possibly due to anemia or low iron levels. -We will order iron studies. -Consider medication if your iron levels are normal.  ABDOMINAL PAIN AND GASSINESS: You have intermittent abdominal pain and gassiness, likely due to Janumet . -Monitor your symptoms and consider switching from Janumet  if they persist.  CONSTIPATION: You experience alternating constipation and diarrhea, possibly related to diet or medication. -Use Miralax after diarrhea subsides. -Continue taking fiber gummies.

## 2023-12-20 ENCOUNTER — Other Ambulatory Visit: Payer: Self-pay | Admitting: Nurse Practitioner

## 2023-12-20 DIAGNOSIS — E1165 Type 2 diabetes mellitus with hyperglycemia: Secondary | ICD-10-CM

## 2023-12-27 ENCOUNTER — Ambulatory Visit (INDEPENDENT_AMBULATORY_CARE_PROVIDER_SITE_OTHER)

## 2023-12-27 DIAGNOSIS — E1165 Type 2 diabetes mellitus with hyperglycemia: Secondary | ICD-10-CM

## 2023-12-27 DIAGNOSIS — G2581 Restless legs syndrome: Secondary | ICD-10-CM

## 2023-12-27 LAB — COMPLETE METABOLIC PANEL WITHOUT GFR
AG Ratio: 1.7 (calc) (ref 1.0–2.5)
ALT: 16 U/L (ref 9–46)
AST: 11 U/L (ref 10–40)
Albumin: 3.8 g/dL (ref 3.6–5.1)
Alkaline phosphatase (APISO): 50 U/L (ref 36–130)
BUN: 20 mg/dL (ref 7–25)
CO2: 24 mmol/L (ref 20–32)
Calcium: 8.9 mg/dL (ref 8.6–10.3)
Chloride: 103 mmol/L (ref 98–110)
Creat: 1.15 mg/dL (ref 0.60–1.29)
Globulin: 2.2 g/dL (ref 1.9–3.7)
Glucose, Bld: 229 mg/dL — ABNORMAL HIGH (ref 65–99)
Potassium: 4.7 mmol/L (ref 3.5–5.3)
Sodium: 135 mmol/L (ref 135–146)
Total Bilirubin: 0.5 mg/dL (ref 0.2–1.2)
Total Protein: 6 g/dL — ABNORMAL LOW (ref 6.1–8.1)

## 2023-12-27 LAB — HEMOGLOBIN A1C
Hgb A1c MFr Bld: 9.9 % — ABNORMAL HIGH (ref <5.7)
Mean Plasma Glucose: 237 mg/dL
eAG (mmol/L): 13.2 mmol/L

## 2023-12-27 LAB — LIPID PANEL
Cholesterol: 212 mg/dL — ABNORMAL HIGH (ref <200)
HDL: 39 mg/dL — ABNORMAL LOW (ref >=40)
LDL Cholesterol (Calc): 130 mg/dL — ABNORMAL HIGH
Non-HDL Cholesterol (Calc): 173 mg/dL — ABNORMAL HIGH (ref <130)
Total CHOL/HDL Ratio: 5.4 (calc) — ABNORMAL HIGH (ref <5.0)
Triglycerides: 282 mg/dL — ABNORMAL HIGH (ref <150)

## 2023-12-27 LAB — IRON,TIBC AND FERRITIN PANEL
%SAT: 24 % (ref 20–48)
Ferritin: 193 ng/mL (ref 38–380)
Iron: 75 ug/dL (ref 50–180)
TIBC: 319 ug/dL (ref 250–425)

## 2023-12-27 NOTE — Progress Notes (Signed)
 Patient here for nurse visit BP check per order from Benjamin Backers, NP.   Patient reports compliance with prescribed BP medications: yes  Last dose of BP medication: This am.  BP Readings from Last 3 Encounters:  12/27/23 138/85  12/15/23 126/80  10/13/23 138/86   Pulse Readings from Last 3 Encounters:  12/27/23 97  12/15/23 88  10/13/23 96    Per Dr. Marylynn she will look at his medciation and come up with a plan. We will let pt know once doctor has had a chance to review and make changes.  Patient verbalized understanding of instructions.   Benjamin Proctor, CMA

## 2023-12-29 DIAGNOSIS — G2581 Restless legs syndrome: Secondary | ICD-10-CM | POA: Insufficient documentation

## 2023-12-29 NOTE — Assessment & Plan Note (Signed)
 Symptoms in left leg, worsened by standing. Possible anemia or low iron levels considered. - Order iron studies. - Consider medication if iron levels normal. - Recommend compression stockings.

## 2023-12-29 NOTE — Assessment & Plan Note (Signed)
 Variable home blood pressure readings, up to 159/106 mmHg. Losartan  50 mg ongoing. Concerns about home machine accuracy. - Calibrate home blood pressure machine at clinic. - Schedule lab work and nurse follow-up for blood pressure check.

## 2023-12-29 NOTE — Assessment & Plan Note (Signed)
 Blood glucose averages 300 mg/dL. Insulin increased to improve control. Janumet  causes abdominal discomfort but is tolerated.  - Increase insulin to 30 units. - Reintroduce glipizide  twice daily. - Monitor A1c in one week. - Consider Ozempic or Rybelsus if side effects persist. - Advise on dietary management.

## 2024-01-01 ENCOUNTER — Other Ambulatory Visit: Payer: Self-pay | Admitting: Nurse Practitioner

## 2024-01-01 DIAGNOSIS — E1165 Type 2 diabetes mellitus with hyperglycemia: Secondary | ICD-10-CM

## 2024-01-03 ENCOUNTER — Other Ambulatory Visit: Payer: Self-pay | Admitting: Nurse Practitioner

## 2024-01-03 DIAGNOSIS — E1165 Type 2 diabetes mellitus with hyperglycemia: Secondary | ICD-10-CM

## 2024-01-08 ENCOUNTER — Other Ambulatory Visit: Payer: Self-pay | Admitting: Nurse Practitioner

## 2024-01-08 DIAGNOSIS — E1165 Type 2 diabetes mellitus with hyperglycemia: Secondary | ICD-10-CM

## 2024-02-26 ENCOUNTER — Telehealth: Payer: Self-pay

## 2024-02-26 ENCOUNTER — Other Ambulatory Visit: Payer: Self-pay | Admitting: Nurse Practitioner

## 2024-02-26 DIAGNOSIS — E1165 Type 2 diabetes mellitus with hyperglycemia: Secondary | ICD-10-CM

## 2024-02-26 NOTE — Telephone Encounter (Signed)
 Copied from CRM [id number]. Topic: Clinical - Prescription Issue >> Feb 26, 2024  4:52 PM [name] wrote: Reason for CRM: [facility] and stated that the medication is $300 Without insurance and patient doesn't have insurance. Shes requesting for a cheaper alternative for the patient

## 2024-02-27 ENCOUNTER — Other Ambulatory Visit (HOSPITAL_COMMUNITY): Payer: Self-pay

## 2024-02-27 NOTE — Telephone Encounter (Signed)
 Called Nat at [facility] and she states the Patient does not currently have Insurance and the Janumet  is over $300 for one month. Patient needs a cheaper alternative if possible.

## 2024-03-01 NOTE — Telephone Encounter (Signed)
 We do not have any samples of Janumet .

## 2024-03-01 NOTE — Telephone Encounter (Signed)
 Can we please check if we have sample of Janumet  50-500 mg.

## 2024-03-04 NOTE — Telephone Encounter (Unsigned)
 Copied from CRM [id number]. Topic: Clinical - Prescription Issue >> Feb 26, 2024  4:52 PM [name] wrote: Reason for CRM: [facility] and stated that the medication is $300 Without insurance and patient doesn't have insurance. Shes requesting for a cheaper alternative for the patient >> Mar 04, 2024 10:18 AM [name] wrote: [facility] in and stated that the medication JANUMET  50-500 MG tablet is $300 without insurance and patient doesn't have insurance. Shes requesting for a cheaper alternative for the patient. [facility] - [place], [place] - [address]  [address] [place] [address]  Phone: [phone] Fax: [phone]

## 2024-03-05 NOTE — Telephone Encounter (Signed)
 He has no insurance at present, what alternatives can be prescribed.

## 2024-03-06 ENCOUNTER — Encounter: Payer: Self-pay | Admitting: Pharmacist

## 2024-03-06 ENCOUNTER — Ambulatory Visit: Payer: Self-pay

## 2024-03-06 NOTE — Telephone Encounter (Signed)
 No triage needed.   Copied from CRM [id number]. Topic: Clinical - Prescription Issue >> Mar 06, 2024 12:19 PM Suzette B wrote: Reason for CRM: JANUMET  50-500 MG tablet [facility] has called requesting a cheaper medication due to his insurance not affordable at this time. Please call Ms. Nat to advise

## 2024-03-06 NOTE — Progress Notes (Signed)
 He has GI side effects with metformin  but was doing okay with Jaunment. We can do glipizide /Metformin  combine and ask him to stop glipizide  and continue insulin.Thank you for your help.

## 2024-03-06 NOTE — Progress Notes (Signed)
 Chart Review Reason: Medication access  Summary: Patient uninsured. Previously taking Janumet . Cost concerns present given no longer covered by [place] Medicaid. No active insurance at this time.   Considerations: Brand medication cost-prohibitive.  Unclear since losing Medicaid if income has increased. May be eligible for Patient assistance program for DPP4i (Janumet  or similar) if income below the cutoff though this would take a few weeks to process.  Cash options:  Metformin /SU - possible risk of hypoglycemia with SU though with A1c >9%, reasonable option. <$10/month via [facility].  Glipizide /metformin  = $9.27 Glipizide  ER = $8.01 Metformin  IR/ER = $6 Pioglitazonemetformin : $7.70/month via [facility] Brenzavvy (SGLT2i): $50/month via [facility] Most insulins are capped at $35/month. If >$35, patient can print a savings card    Manuelita FABIENE Kobs, PharmD Clinical Pharmacist [facility] [phone]

## 2024-03-07 ENCOUNTER — Other Ambulatory Visit (HOSPITAL_COMMUNITY): Payer: Self-pay

## 2024-03-07 ENCOUNTER — Telehealth: Payer: Self-pay | Admitting: Pharmacist

## 2024-03-07 DIAGNOSIS — E1165 Type 2 diabetes mellitus with hyperglycemia: Secondary | ICD-10-CM

## 2024-03-07 NOTE — Progress Notes (Signed)
 Brief Telephone Documentation Reason for Call: Medication Access  Summary of Call: Called patient to discuss Medication Access Options / review pharmacy savings programs.   Patient notes he is getting low on Janumet  and glipizide . Has a 37-month supply of insulin and pen needles. Notes losartan  is a reasonable price.   Patient states his wife works at [employer] and has been a help in [employer] of medications offered.   Discussed several options regarding reducing drug cost. Patient plans to review and will let us  know what his preferences are. He is agreeable with continuation of insulin, glipizide  and metformin . Has no preference between combination pill vs individual agents. PCP noted prior GI upset with metformin , reasonable to transition to metformin  XR given appears cost is similar at [employer].   [facility] offers certain generic medicines for $5-$15 per month.  Glipizide  10mg  - $10/monthMetformin  - $5/monthLosartan  - $5/month      [facility] $5 shipping fee per order regardless of #prescriptions/day supply Glipizide  10 mg, 60 tablets = $7.46 Metformin : 30 days = $5.64 Metformin  XR (extended release = no stomach upset/side effects): 30 days = $6.38 Combination pill is in a fixed ratio of glip5/metformin  500 so may not be more feasible option given his 10 mg BID glipizide  dose.      Lantus : $35/month via drug coupon card (give this to your wife to add to your pharmacy profile)      Programs based on income [facility] - Free medication to those who qualify (see below) If you and your wife together make less than $63,450 year you would qualify.  Basal insulin offered Possibly metformin , losartan  (drug list changes over ~October)  Follow Up: Patient given direct line for further questions/concerns.  Manuelita FABIENE Kobs, PharmD Clinical Pharmacist [facility] [phone]

## 2024-03-08 NOTE — Telephone Encounter (Signed)
 Thank you so much

## 2024-03-13 ENCOUNTER — Other Ambulatory Visit: Payer: Self-pay | Admitting: Nurse Practitioner

## 2024-03-13 DIAGNOSIS — E1165 Type 2 diabetes mellitus with hyperglycemia: Secondary | ICD-10-CM

## 2024-03-13 MED ORDER — METFORMIN HCL ER 500 MG PO TB24: 500.0000 mg | ORAL_TABLET | Freq: Two times a day (BID) | ORAL | 3 refills | Status: DC

## 2024-03-14 NOTE — Telephone Encounter (Signed)
 Thank you :)

## 2024-05-13 ENCOUNTER — Other Ambulatory Visit: Payer: Self-pay | Admitting: Internal Medicine

## 2024-05-13 ENCOUNTER — Telehealth: Payer: Self-pay

## 2024-05-13 ENCOUNTER — Other Ambulatory Visit: Payer: Self-pay | Admitting: Nurse Practitioner

## 2024-05-13 NOTE — Telephone Encounter (Signed)
 Detailed vm left and mychart sent askign pt to call and schedule a follow up. refill sent in due to refill protocol pt was seen within the year

## 2024-05-13 NOTE — Telephone Encounter (Signed)
°  Detailed vm left asking pt to CB to schedule an appt mychart msg will be sent as well as pt's LOV was 12-15-23 and provider wanted pt to return the following month for a follow up.    E2C2 PLEASE HAVE PT SCHEDULED A FOLLOW UP WITH PCP

## 2024-05-15 ENCOUNTER — Other Ambulatory Visit: Payer: Self-pay | Admitting: Family

## 2024-05-15 ENCOUNTER — Encounter: Payer: Self-pay | Admitting: Nurse Practitioner

## 2024-05-15 ENCOUNTER — Telehealth: Payer: Self-pay

## 2024-05-15 MED ORDER — OSELTAMIVIR PHOSPHATE 75 MG PO CAPS: 75.0000 mg | ORAL_CAPSULE | Freq: Every day | ORAL | 0 refills | Status: AC

## 2024-05-15 NOTE — Telephone Encounter (Signed)
 Copied from CRM #[id number]. Topic: Clinical - Medical Advice >> May 15, 2024  1:18 PM Benjamin Proctor GRADE wrote: Reason for CRM: Patient's daughter was diagnosed with the flu type B, her daughter's pediatrician advised to contact our off and request tamiflu  to help prevent the patient from catching it. She is not experiencing symptoms.

## 2024-05-16 NOTE — Telephone Encounter (Signed)
 Benjamin Proctor, CMA has notified patient via MyChart of Benjamin Roys, FNP recommendations.

## 2024-05-17 MED ORDER — GLIPIZIDE 10 MG PO TABS: 10.0000 mg | ORAL_TABLET | Freq: Every day | ORAL | 0 refills | Status: AC

## 2024-05-17 NOTE — Addendum Note (Signed)
 Addended by: ANICE BELT on: 05/17/2024 10:40 AM   Modules accepted: Orders

## 2024-06-28 ENCOUNTER — Ambulatory Visit: Admitting: Nurse Practitioner

## 2024-07-19 ENCOUNTER — Ambulatory Visit: Admitting: Nurse Practitioner
